# Patient Record
Sex: Female | Born: 1955 | Race: White | Hispanic: No | Marital: Married | Smoking: Never smoker
Health system: Southern US, Community
[De-identification: ages and names within clinical notes are randomized; demographics above are authoritative.]

## PROBLEM LIST (undated history)

## (undated) DIAGNOSIS — C569 Malignant neoplasm of unspecified ovary: Secondary | ICD-10-CM

## (undated) DIAGNOSIS — T4145XA Adverse effect of unspecified anesthetic, initial encounter: Secondary | ICD-10-CM

## (undated) DIAGNOSIS — R112 Nausea with vomiting, unspecified: Secondary | ICD-10-CM

## (undated) DIAGNOSIS — Z9889 Other specified postprocedural states: Secondary | ICD-10-CM

## (undated) DIAGNOSIS — T8859XA Other complications of anesthesia, initial encounter: Secondary | ICD-10-CM

## (undated) DIAGNOSIS — K219 Gastro-esophageal reflux disease without esophagitis: Secondary | ICD-10-CM

## (undated) HISTORY — PX: NASAL SINUS SURGERY: SHX719

## (undated) HISTORY — PX: BREAST SURGERY: SHX581

## (undated) HISTORY — PX: CHOLECYSTECTOMY: SHX55

## (undated) HISTORY — PX: TUBAL LIGATION: SHX77

## (undated) HISTORY — PX: ABDOMINAL HYSTERECTOMY: SHX81

---

## 1996-02-22 HISTORY — PX: BREAST EXCISIONAL BIOPSY: SUR124

## 1998-03-27 ENCOUNTER — Other Ambulatory Visit: Admission: RE | Admit: 1998-03-27 | Discharge: 1998-03-27 | Payer: Self-pay | Admitting: Obstetrics & Gynecology

## 1999-02-09 ENCOUNTER — Other Ambulatory Visit: Admission: RE | Admit: 1999-02-09 | Discharge: 1999-02-09 | Payer: Self-pay | Admitting: Obstetrics & Gynecology

## 1999-02-09 ENCOUNTER — Encounter (INDEPENDENT_AMBULATORY_CARE_PROVIDER_SITE_OTHER): Payer: Self-pay | Admitting: Specialist

## 1999-03-08 ENCOUNTER — Observation Stay (HOSPITAL_COMMUNITY): Admission: RE | Admit: 1999-03-08 | Discharge: 1999-03-09 | Payer: Self-pay | Admitting: Obstetrics & Gynecology

## 1999-03-08 ENCOUNTER — Encounter (INDEPENDENT_AMBULATORY_CARE_PROVIDER_SITE_OTHER): Payer: Self-pay | Admitting: Specialist

## 1999-08-17 ENCOUNTER — Encounter (HOSPITAL_BASED_OUTPATIENT_CLINIC_OR_DEPARTMENT_OTHER): Payer: Self-pay | Admitting: General Surgery

## 1999-08-19 ENCOUNTER — Ambulatory Visit (HOSPITAL_COMMUNITY): Admission: RE | Admit: 1999-08-19 | Discharge: 1999-08-20 | Payer: Self-pay | Admitting: General Surgery

## 1999-08-27 ENCOUNTER — Emergency Department (HOSPITAL_COMMUNITY): Admission: EM | Admit: 1999-08-27 | Discharge: 1999-08-27 | Payer: Self-pay | Admitting: Emergency Medicine

## 2002-05-23 ENCOUNTER — Ambulatory Visit (HOSPITAL_COMMUNITY): Admission: RE | Admit: 2002-05-23 | Discharge: 2002-05-23 | Payer: Self-pay | Admitting: Family Medicine

## 2002-05-23 ENCOUNTER — Encounter: Payer: Self-pay | Admitting: Family Medicine

## 2002-06-17 ENCOUNTER — Emergency Department (HOSPITAL_COMMUNITY): Admission: EM | Admit: 2002-06-17 | Discharge: 2002-06-17 | Payer: Self-pay | Admitting: Emergency Medicine

## 2002-08-20 ENCOUNTER — Emergency Department (HOSPITAL_COMMUNITY): Admission: EM | Admit: 2002-08-20 | Discharge: 2002-08-20 | Payer: Self-pay | Admitting: Emergency Medicine

## 2004-04-13 ENCOUNTER — Encounter: Admission: RE | Admit: 2004-04-13 | Discharge: 2004-04-13 | Payer: Self-pay | Admitting: Sports Medicine

## 2004-04-27 ENCOUNTER — Encounter: Admission: RE | Admit: 2004-04-27 | Discharge: 2004-04-27 | Payer: Self-pay | Admitting: Sports Medicine

## 2004-05-27 ENCOUNTER — Encounter: Admission: RE | Admit: 2004-05-27 | Discharge: 2004-05-27 | Payer: Self-pay | Admitting: Sports Medicine

## 2004-07-01 ENCOUNTER — Ambulatory Visit (HOSPITAL_BASED_OUTPATIENT_CLINIC_OR_DEPARTMENT_OTHER): Admission: RE | Admit: 2004-07-01 | Discharge: 2004-07-01 | Payer: Self-pay | Admitting: Otolaryngology

## 2004-07-04 ENCOUNTER — Ambulatory Visit: Payer: Self-pay | Admitting: Internal Medicine

## 2005-03-18 ENCOUNTER — Encounter (INDEPENDENT_AMBULATORY_CARE_PROVIDER_SITE_OTHER): Payer: Self-pay | Admitting: *Deleted

## 2005-03-18 ENCOUNTER — Ambulatory Visit (HOSPITAL_BASED_OUTPATIENT_CLINIC_OR_DEPARTMENT_OTHER): Admission: RE | Admit: 2005-03-18 | Discharge: 2005-03-18 | Payer: Self-pay | Admitting: Otolaryngology

## 2007-08-03 ENCOUNTER — Ambulatory Visit (HOSPITAL_COMMUNITY): Admission: RE | Admit: 2007-08-03 | Discharge: 2007-08-03 | Payer: Self-pay | Admitting: Family Medicine

## 2010-03-14 ENCOUNTER — Encounter: Payer: Self-pay | Admitting: Sports Medicine

## 2010-07-09 NOTE — Consult Note (Signed)
NAME:  [name], [name]                          ACCOUNT NO.:  [id number]   MEDICAL RECORD NO.:  [id number]                   PATIENT TYPE:  EMS   LOCATION:  MAJO                                 FACILITY:  [facility]   PHYSICIAN:  Genene Churn. Love, M.D.                 DATE OF BIRTH:  [birth year]   DATE OF CONSULTATION:  06/17/2002  DATE OF DISCHARGE:                                   CONSULTATION   PATIENT ADDRESS:  [address]  [place], [place] [address]   CONSULTATION:  This 55 year old white divorced female was seen in the  emergency room at the request of Dr. Ignacia Palma, since she has an appointment  to be seen for new-patient evaluation by [facility] and  came to the emergency room today.   Her complaint is that of left hip and leg pain.   HISTORY OF PRESENT ILLNESS:  This patient has a greater than one-year  history of left groin, left hip and left leg pain involving the left  anterior and, at times, inner aspect of the leg, going to the knee.  This,  however, occasionally does go below the knee, into the left L4 dermatomal  distribution.  Her pain can go down the left leg from the groin in two  directions, one along the inner aspect and another along the anterior and  lateral aspect of her leg.  It has been worse over the last two months, and  she has seen a chiropractor without any relief.  She was seen by Dr. Tasia Catchings,  the orthopedist, in [place].  She had MRI study of the hips 05/23/02,  which I reviewed, showing a right ovarian cyst but no other abnormalities.  She  had an MRI of the lumbar spine 06/05/02, which I reviewed, which shows  no significant abnormalities.  She says the pain is like being hit with a  hammer.  There is no associated burning, numbness or tingling, but she  notices a hyperesthetic area along the lateral aspect of her left leg.  She  has also noted, at times, that this will go down her right leg.  She states  it is not related  to bending over, cough, sneeze, or Valsalva maneuver.  It  is possibly made worse by being in the sitting position such as riding in a  car for a prolonged period of time or riding a riding lawnmower.  She does  notice that it can awaken her at night.  She feels more comfortable with her  legs flexed at the knees.  She has had no history of Lhermitte sign, no  history of head or neck trauma and no bowel or bladder dysfunction.  Review  of the MRI study in the retroperitoneal region, by my review, shows no  significant abnormalities.   SOCIAL HISTORY:  She does not smoke cigarettes or drink alcohol.   ALLERGIES:  She does have an intolerance to SULFA, developing nausea and  vomiting.   CURRENT MEDICATIONS:  1. Tylenol.  2. Imitrex.  3. Tramadol.   PAST SURGICAL HISTORY:  She has had operations, with wisdom teeth removed 20  years ago, tubal ligation 17 years ago, sinus surgery in November of 1997,  left breast biopsy, August 1998.  She had a hysterectomy, January of 2001  and a cholecystectomy, June of 2001.   FAMILY HISTORY:  Her mother died at age 62 from ALS.  Her father is 42,  doing well.  She has a brother 68 and well.  She has two children, 17 and  18.  She is divorced and plans to be married in the near future.   PHYSICAL EXAMINATION:  GENERAL:  A well-developed pleasant white female in  no acute distress.  VITAL SIGNS:  Her blood pressure in right and left arm is 120/60 in lying  position with a heart rate of 60 and regular.  No carotid or supraclavicular  bruits could be heard.  NEUROLOGICAL:  Mechanical testing revealed negative compression and  palpation over the anterior iliac crest with some slight tenderness on the  left side.  She had, also, no discomfort over the SI joints bilaterally.  Forward bending, lateral rotation, lateral bending maneuvers and lateral  rotation maneuvers of the truncal region were unremarkable.  Internal and  external rotation maneuvers of  the hips were unremarkable.  Mental Status:  She was alert, oriented x3, followed 1, 2 and 3 step  commands.  Cranial nerve examination revealed visual fields full, discs  flat, spontaneous venous pulsations and extraocular movements full.  Cornea;s present.  Facial sensation equal.  No facial motor asymmetry.  Hearing present.  Air conduction greater than bone conduction.  Tongue  midline.  Uvula midline.  Gag is present.  Sternocleidomastoid, trapezius  testing normal.  Motor examination:  Good strength in the upper and lower  extremities, including iliopsoas, quadriceps, femoris and gluteus maximus,  gluteus medius, EHL, anterior tibialis, peronei, posterior tibialis and  gastroc soleus muscle groups.  The sensory examination was intact, including  the left lateral femoral cutaneous nerve distribution.  The deep tendon  reflexes were 2-3+ in the lower extremities, 2+ in the upper extremities.  Gait examination revealed good tandem toe-and-heel walking.  Romberg testing  was negative.   LABORATORY DATA:  Review was obtained today of hip x-rays, lumbar spine x-  rays, AP and lateral, MRI of the lumbar spine and MRI of the hip region.   IMPRESSION:  Suspect left-sided meralgia paresthetic, code 355.1   PLAN:  1. Nerve conduction EMG.  2. Neurontin 100 mg t.i.d., then 300 mg t.i.d.                                               Genene Churn. Sandria Manly, M.D.    JML/MEDQ  D:  06/17/2002  T:  06/18/2002  Job:  [id number]   cc:   Casimiro Needle L. Thad Ranger, M.D.  [address]  [address]  [place]  [place] [address]  Fax: [phone]   Carleene Cooper III, M.D.  [address]  [place]  [place] [address]  Fax: [phone]

## 2010-07-09 NOTE — Op Note (Signed)
[facility]  Patient:    [name]                        MRN: [id number] Proc. Date: 03/08/99 Adm. Date:  [id number] Attending:  Lars Pinks                           Operative Report  PREOPERATIVE DIAGNOSIS:       Menorrhagia.  POSTOPERATIVE DIAGNOSIS:      Menorrhagia.  OPERATION:                    Transvaginal hysterectomy.  SURGEON:                      Richard D. Arlyce Dice, M.D.  ASSISTANT:                    Luvenia Redden, M.D.  ANESTHESIA:                   General endotracheal.  ESTIMATED BLOOD LOSS:         100 cc.  FINDINGS:                     Normal-appearing uterus and ovaries.  INDICATIONS:                  This is a 55 year old, gravida 2, para 2, who presented with a 18-month history of heavy menstrual periods.  The periods are o heavy that she floods through her pads and tampons and has to leave work to change her clothes.  During this time her hemoglobin has been in the upper 10 to low 11 range, and she does not respond to iron therapy.  The patient has tried birth control pills in the past, which caused severe headaches, and she used high doses of nonsteroidal anti-inflammatory drugs, and these also failed to abate the heavy flow.  The patient underwent a uterine ultrasound and sonographic hysterogram, nd this showed no evidence of polyps or submucous myoma.  Because of the persistence of the heavy bleeding and the fact that this symptom interfered with the patients work and social activities, the decision was made to proceed with hysterectomy.  DESCRIPTION OF PROCEDURE:     The patient was taken to the operating room, and general anesthesia was induced.  She was then placed in the dorsolithotomy position, and the vagina, perineum were prepped and draped in a sterile fashion. The cervix was grasped with a Christella Hartigan tenaculum, and the paracervical tissues were infiltrated with a dilute epinephrine  solution of 0.5% Marcaine.  The cervix was circumcised, and the vaginal tissues were then dissected free.  The bladder was  freed anteriorly.  The posterior cul-de-sac was then entered sharply.  The uterosacral ligaments and base of the cardinal ligaments were clamped, cut, and  ligated.  The anterior cul-de-sac was then entered sharply.  The uterine artery was then clamped and doubly ligated.  The dissection was continued through the broad ligament.  The uterus was then delivered posteriorly, and the remainder of the broad ligament and utero-ovarian anastomosis were then clamped and doubly ligated. The uterus and cervix were removed.  The posterior vaginal cuff was then closed  with a running Vicryl #1 suture.  The cul-de-sac was closed with an interrupted  chromic suture.  The peritoneum was closed with a purse-string  chronic suture. The anterior bladder cuff was then closed with interrupted chronic suture.  The procedure was then terminated, and the patient left the operating room in good condition.  The bladder was catheterized while the patient was in the operating  room, and clear urine was obtained. DD:  03/08/99 TD:  03/08/99 Job: [id number]

## 2010-07-09 NOTE — Op Note (Signed)
[name]  Patient:    [name], [name]                       MRN: [id number] Proc. Date: 08/19/99 Adm. Date:  [id number] Attending:  Sonda Primes CC:         Mardene Celeste. Lurene Shadow, M.D., 2 copies                           Operative Report  PREOPERATIVE DIAGNOSIS:  Biliary dyskinesia.  POSTOPERATIVE DIAGNOSIS:  Biliary dyskinesia.  OPERATION:  Laparoscopic cholecystectomy.  SURGEON:  Mardene Celeste. Lurene Shadow, M.D.  ASSISTANT:  Marnee Spring. Wiliam Ke, M.D.  ANESTHESIA:  General.  INDICATIONS FOR PROCEDURE:  This patient is a 55 year old woman presenting with recurrent right upper quadrant pain following meals, associated with nausea and with some emesis.  She underwent gallbladder ultrasonography which showed no stones and subsequently with hepatobiliary scan which showed no cystic duct obstruction, but did show there was no more than a 12% ejection from the gallbladder, consistent with dyskinesia.  She is brought to the operating room now for laparoscopic cholecystectomy.  DESCRIPTION OF OPERATION:  Following induction of anesthesia, the patient was positioned supine and the abdomen is routinely prepped and draped to be included in the sterile operative field.  Opened laparoscopy at the umbilicus with insufflation of the peritoneum to 14 mm of mercury.  Pressure using carbon dioxide was carried out.  Camera was inserted and visual exploration of the abdomen ensued.  Anterior gastric wall, duodenum appeared to be normal. There were some adhesions from the neck of the gallbladder to the duodenum. The gallbladder was moderately scarred and liver edges were sharp, liver surface smooth.  Neither the small nor large intestine appeared to be abnormal.  On direct vision, epigastric and lateral ports were placed.  The gallbladder was grasped and retracted cephalad and dissection carried down in the region of the hepatoduodenal ligament, isolating the cystic  artery and cystic duct. The cystic artery was doubly clipped and transected.  The cystic duct was clipped proximally and also opened.  I attempted a cystic duct cholangiogram, however, the cystic duct was very small and the catheter could not be placed into the very small caliber cystic duct.  The cholangiogram effort was abandoned and the cystic duct was triply clipped and then transected and the gallbladder dissected free from the liver bed using electrocautery and maintaining hemostasis throughout the entire course of the dissection.  At the end of the dissection, the liver bed was thoroughly inspected for hemostasis and noted to be dry.  The right upper quadrant was thoroughly irrigated with saline and aspirated.  Camera was placed in the epigastric port and the gallbladder retrieved through the umbilical port without difficulty.  Pneumoperitoneum allowed to deflate. Sponge, instrument and sharp counts verified.  Wounds were closed in layers as follows:  Umbilical wound in two layers with 0 Dexon and 4-0 Dexon, epigastric wound and lateral flank wounds closed with 4-0 Dexon.  All wounds were reinforced with Steri-Strips.  Sterile dressings were applied.  Anesthetic was reversed and patient removed form the operating room to the recovery room in stable condition, having tolerated the procedure well. DD:  08/19/99 TD:  08/20/99 Job: [id number]

## 2010-07-09 NOTE — H&P (Signed)
NAMESEDONIA, [name]                 ACCOUNT NO.:  [id number]   MEDICAL RECORD NO.:  [id number]          PATIENT TYPE:  AMB   LOCATION:  DSC                          FACILITY:  [facility]   PHYSICIAN:  Hermelinda Medicus, M.D.   DATE OF BIRTH:  1955-05-25   DATE OF ADMISSION:  DATE OF DISCHARGE:                                HISTORY & PHYSICAL   This patient is a 55 year old female who has had a sleep study showing some  fatigue, but really not a sleep apnea totally problem.  Her oxygen  desaturation was 91%.  Her RDI was 5.5 making it seem more like a snoring  type problem.  She has been waking up fatigued.  She has had a considerable  snoring difficulty, breathing through her nose, waking herself up because of  nasal obstruction.  She now enters for a septal reconstruction/turbinate  reduction with an LAUP (laser-assisted uvulopalatoplasty) under local MAC  anesthesia.  Her past history is that of being on Imitrex 25 and Allegra-D  one-half tablet h.s. and in the a.m. as necessary.  She does not drink.  She  does not smoke.  She has had nasal surgery in 1997, gallbladder in 2001,  breast biopsy in 1998, tubal in 1987, a hysterectomy in 2001.  She does have  occasional migraines, TMJ problems.   PHYSICAL EXAMINATION:  GENERAL:  She is a well-nourished, well-developed  female in no acute distress.  VITAL SIGNS:  Blood pressure 115/79, heart rate 94.  Hemoglobin is 13.1.  She weighs 142 with a BMI of 23.  5 feet 6 inches tall.  HEENT:  Her ears are clear.  The tympanic membranes are clear.  Her nose  shows considerable congestion and septal deviation with turbinate  hypertrophy blocking quite a narrow nose.  Her palate is somewhat low.  Her  uvula is large and her larynx is clear.  True cords, false cords,  epiglottis, base of tongue are clear.  True cord mobility, gag reflex,  tongue mobility, EOMs, facial nerve are all within normal limits and  symmetrical as is shoulder strength.  Neck is  free of any thyromegaly,  cervical adenopathy, or mass.  Oral cavity is clear.  No ulceration or mass.  CHEST:  Clear.  No rales, rhonchi, or wheezes.  CARDIOVASCULAR:  No heaves, snaps, murmurs or gallops.  ABDOMEN:  Unremarkable.  EXTREMITIES:  Unremarkable.   INITIAL DIAGNOSES:  Nasal obstruction with history of snoring with redundant  uvula and palate.           ______________________________  Hermelinda Medicus, M.D.     JC/MEDQ  D:  03/18/2005  T:  03/18/2005  Job:  [id number]   cc:   Kirk Ruths, M.D.  Fax: [phone]

## 2010-07-09 NOTE — Op Note (Signed)
NAMETAQUISHA, [name]                 ACCOUNT NO.:  [id number]   MEDICAL RECORD NO.:  [id number]          PATIENT TYPE:  AMB   LOCATION:  DSC                          FACILITY:  [facility]   PHYSICIAN:  [name], M.D.   DATE OF BIRTH:  07-10-1955   DATE OF PROCEDURE:  03/18/2005  DATE OF DISCHARGE:                                 OPERATIVE REPORT   PREOP DIAGNOSIS:  Snoring with septal deviation with nasal obstruction with  interrupted sleep with fatigue with a RDI of 5.5 and O2 nadir of 91%.   POSTOP DIAGNOSIS:  Snoring with septal deviation with nasal obstruction with  interrupted sleep with fatigue with a RDI of 5.5 and O2 nadir of 91%.   OPERATION:  Septal reconstruction, turbinate reduction and a laser assisted  uvulopalatoplasty.   OPERATOR:  Dr. Hermelinda Cherry.   ANESTHESIA:  MAC with Dr. Gelene Mink.   PROCEDURE:  Patient placed in supine position and under local anesthesia,  the patient was prepped and draped appropriately using the usual head drape.  The topical cocaine 200 mg and 1% Xylocaine with epinephrine was used on the  nasal cavity and the nose had had previous surgery back in 97, but still a  persistent septal deviation in the high and the ethmoid region, especially  on the right side and a thickening in that area is blocking this very narrow  nose.  An incision after anesthesia was instilled was made anterior to this  ethmoid region and the mucoperichondrium and periosteum was elevated on that  side on the right side and then on the left side in the septal envelope.  Once this was achieved, then [facility] forceps were used to take this excess  and deviated ethmoid septum back to where the septum was established in the  midline.  We were able also to push some of this deviated septum over to the  midline so it would save [place] removing this.  Once this was completed, the  vomerine septum was in good condition. She had a very small perforation of  the floor the nose  which we wanted to avoid and then the closure was begun  using 4-0 through-and-through septal suture as a mattress stitch and once  this was completed, then the inferior turbinates were aggressively  outfractured.  We felt that it was not necessary to use Elmed bipolar  cautery. The middle turbinates were also checked and were also partially  obstructing and were slightly infractured. Once this was achieved,  intranasal dressing was placed and then the oral cavity was approached using  the Cetacaine spray and then using 1% Xylocaine with epinephrine in the  uvula and just lateral to this where the laser incisions were to be made.  Once the throat was numb, the wooden tongue blade was used.  The eyes were  protected with wet sponges and a wet towel and glasses were used within the  room.  The laser was set at 10 W and used for 6 minutes. The laser was used  each side of the uvula to make diagonal cuts in the  palate to get this to  heal and elevate the contour the palate slightly approximately 5-7 mm. The  uvula which was redundant and large.  Half of this was taken  again using the laser.  Once this was completed, the oropharynx was  suctioned, the patient did very well, was taken to recovery room and we will  probably remove this packing this afternoon in the office. Her follow-up  furthermore will be in the office this afternoon, then will be in 5 days, 10  days and 3 weeks, 6 weeks, 3 months and 6 months.           ______________________________  [name], M.D.     JC/MEDQ  D:  03/18/2005  T:  03/18/2005  Job:  [id number]   cc:   Kirk Ruths, M.D.  Fax: [phone]

## 2010-07-09 NOTE — Procedures (Signed)
[name], [name]                 ACCOUNT NO.:  [id number]   MEDICAL RECORD NO.:  [id number]          PATIENT TYPE:  OUT   LOCATION:  SLEEP CENTER                 FACILITY:  [facility]   PHYSICIAN:  Clinton D. Maple Hudson, M.D. DATE OF BIRTH:  1955-10-27   DATE OF STUDY:                              NOCTURNAL POLYSOMNOGRAM   REFERRING PHYSICIAN:  Hermelinda Medicus, M.D.   INDICATIONS FOR STUDY:  Hypersomnia with sleep apnea. Epworth sleepiness  score 13/24, BMI 22, weight 137 pounds.   SLEEP ARCHITECTURE:  Total sleep time 428 minutes with sleep efficiency of  86%. Stage I was 4%, stage II was 71%, stages III and IV were 13%, REM was  12% of total sleep time. Sleep latency was 28 minutes. REM latency 369  minutes. Awake after sleep onset 42 minutes. Arousal index increased at 47.  She took Celexa at bedtime for headache.   RESPIRATORY DATA:  Respiratory disturbance index (RDI, AHI) 5.5 obstructive  events per hour indicating minimal obstructive sleep apnea/hypopnea  syndrome.  There were 10 obstructive apneas and 29 hypopneas. Most events  were recorded while sleeping supine REM RDI of 0.  NPSG protocol was  ordered.   OXYGEN DATA:  Mild snoring while lying supine.  Oxygen desaturation was to a  nadir of 91% with mean oxygen saturation through the study of 98% on room  air.   CARDIAC DATA:  Sinus rhythm with occasional PVC.   MOVEMENT/PARASOMNIA:  Occasional leg jerks with arousal, 1.1 per hour,  nonsignificant.   IMPRESSION/RECOMMENDATIONS:  1.  Adequate total sleep time with unremarkable sleep architecture, but she      still complained of being tired the next morning.  Question if Celexa at      bedtime is contributing to this feeling or there is a disorder of      primary increase in daytime somnolence.  2.  Minimal obstructive sleep apnea/hypopnea, RDI of 5.5 per hour with      events especially noted while lying supine.     CDY/MEDQ  D:  07/04/2004 14:51:15  T:  07/04/2004  19:55:54  Job:  [id number]

## 2012-04-24 DIAGNOSIS — M7710 Lateral epicondylitis, unspecified elbow: Secondary | ICD-10-CM | POA: Insufficient documentation

## 2013-02-21 HISTORY — PX: COLONOSCOPY: SHX174

## 2014-01-07 ENCOUNTER — Other Ambulatory Visit: Payer: Self-pay | Admitting: Obstetrics & Gynecology

## 2014-01-07 DIAGNOSIS — R928 Other abnormal and inconclusive findings on diagnostic imaging of breast: Secondary | ICD-10-CM

## 2014-01-21 ENCOUNTER — Ambulatory Visit
Admission: RE | Admit: 2014-01-21 | Discharge: 2014-01-21 | Disposition: A | Discharge status: Home / Self Care | Payer: BC Managed Care – PPO | Source: Ambulatory Visit | Attending: Obstetrics & Gynecology | Admitting: Obstetrics & Gynecology

## 2014-01-21 DIAGNOSIS — R928 Other abnormal and inconclusive findings on diagnostic imaging of breast: Secondary | ICD-10-CM

## 2014-02-06 ENCOUNTER — Encounter (HOSPITAL_COMMUNITY): Payer: Self-pay

## 2014-02-06 ENCOUNTER — Encounter (HOSPITAL_COMMUNITY)
Admission: RE | Admit: 2014-02-06 | Discharge: 2014-02-06 | Disposition: A | Discharge status: Home / Self Care | Payer: BC Managed Care – PPO | Source: Ambulatory Visit | Attending: Obstetrics & Gynecology | Admitting: Obstetrics & Gynecology

## 2014-02-06 DIAGNOSIS — K219 Gastro-esophageal reflux disease without esophagitis: Secondary | ICD-10-CM | POA: Diagnosis not present

## 2014-02-06 DIAGNOSIS — Z886 Allergy status to analgesic agent status: Secondary | ICD-10-CM | POA: Diagnosis not present

## 2014-02-06 DIAGNOSIS — D3912 Neoplasm of uncertain behavior of left ovary: Secondary | ICD-10-CM | POA: Diagnosis not present

## 2014-02-06 DIAGNOSIS — R1900 Intra-abdominal and pelvic swelling, mass and lump, unspecified site: Secondary | ICD-10-CM | POA: Diagnosis present

## 2014-02-06 HISTORY — DX: Adverse effect of unspecified anesthetic, initial encounter: T41.45XA

## 2014-02-06 HISTORY — DX: Gastro-esophageal reflux disease without esophagitis: K21.9

## 2014-02-06 HISTORY — DX: Other complications of anesthesia, initial encounter: T88.59XA

## 2014-02-06 LAB — CBC
HEMATOCRIT: 37.9 % (ref 36.0–46.0)
HEMOGLOBIN: 13.1 g/dL (ref 12.0–15.0)
MCH: 30.5 pg (ref 26.0–34.0)
MCHC: 34.6 g/dL (ref 30.0–36.0)
MCV: 88.3 fL (ref 78.0–100.0)
Platelets: 249 10*3/uL (ref 150–400)
RBC: 4.29 MIL/uL (ref 3.87–5.11)
RDW: 13.3 % (ref 11.5–15.5)
WBC: 7.6 10*3/uL (ref 4.0–10.5)

## 2014-02-06 NOTE — Patient Instructions (Addendum)
Your procedure is scheduled on:02/07/14  Enter through the Main Entrance at :[place] [facility] desk phone and dial [phone] and inform us of your arrival.  Please call [phone] if you have any problems the morning of surgery.  Remember: Do not eat food or drink liquids, including water, after midnight:tonight Clear liquids are ok until:7am on Friday   You may brush your teeth the morning of surgery.  Take these meds the morning of surgery with a sip of water:Omeprazole  DO NOT wear jewelry, eye make-up, lipstick,body lotion, or dark fingernail polish.  (Polished toes are ok) You may wear deodorant.  If you are to be admitted after surgery, leave suitcase in car until your room has been assigned. Patients discharged on the day of surgery will not be allowed to drive home. Wear loose fitting, comfortable clothes for your ride home.

## 2014-02-06 NOTE — H&P (Signed)
[name] is an 58 y.o. female. She was found to have a 17 cm adnexal mass that appeared benign on ultrasound.  Her CA 125 was 8.  I conferred with Dr. Everitt Amber and she felt that it was OK to proceed with laparoscopic removal.  This was discussed with the patient and she understands the small added risk of minimally invasive surgery if this does indeed turn out to be malignant.  Pertinent Gynecological History: Blood transfusions: none Sexually transmitted diseases: no past history Previous GYN Procedures: Vaginal Hysterectomy  Last mammogram: normal Date: 01/21/2014 OB History: G2, P2     Past Medical History  Diagnosis Date  . GERD (gastroesophageal reflux disease)   . Complication of anesthesia     "hard to wake up"    Past Surgical History  Procedure Laterality Date  . Tubal ligation    . Abdominal hysterectomy    . Cholecystectomy    . Breast surgery    . Nasal sinus surgery      No family history on file.  Social History:  reports that she has never smoked. She does not have any smokeless tobacco history on file. She reports that she does not drink alcohol or use illicit drugs.  Allergies:  Allergies  Allergen Reactions  . Tylenol [Acetaminophen] Rash    Severe hives    No prescriptions prior to admission    Review of Systems  Constitutional: Negative.   Eyes: Negative.   Respiratory: Negative.   Cardiovascular: Negative.   Gastrointestinal: Negative.   Genitourinary: Negative.   Neurological: Positive for headaches.    There were no vitals taken for this visit. Physical Exam  Constitutional: She is oriented to person, place, and time. She appears well-developed and well-nourished.  HENT:  Head: Normocephalic.  Eyes: Pupils are equal, round, and reactive to light.  Neck: Normal range of motion.  Cardiovascular: Normal rate.   Respiratory: Effort normal.  GI: She exhibits mass.  Genitourinary: Vagina normal.  Musculoskeletal: Normal range of  motion.  Neurological: She is alert and oriented to person, place, and time.  Skin: Skin is warm and dry.  Psychiatric: She has a normal mood and affect.    Results for orders placed or performed during the hospital encounter of 02/06/14 (from the past 24 hour(s))  CBC     Status: None   Collection Time: 02/06/14  9:18 AM  Result Value Ref Range   WBC 7.6 4.0 - 10.5 K/uL   RBC 4.29 3.87 - 5.11 MIL/uL   Hemoglobin 13.1 12.0 - 15.0 g/dL   HCT 37.9 36.0 - 46.0 %   MCV 88.3 78.0 - 100.0 fL   MCH 30.5 26.0 - 34.0 pg   MCHC 34.6 30.0 - 36.0 g/dL   RDW 13.3 11.5 - 15.5 %   Platelets 249 150 - 400 K/uL    No results found.  Assessment/Plan: 17 cm adnexal mass that appears benign on ultrasound and normal CA 125.  Will proceed with laparoscopic BSO with removal of cyst.  Traeger Sultana D 02/06/2014, 3:35 PM

## 2014-02-07 ENCOUNTER — Ambulatory Visit (HOSPITAL_COMMUNITY)
Admission: RE | Admit: 2014-02-07 | Discharge: 2014-02-07 | Disposition: A | Discharge status: Home / Self Care | Payer: BC Managed Care – PPO | Source: Ambulatory Visit | Attending: Obstetrics & Gynecology | Admitting: Obstetrics & Gynecology

## 2014-02-07 ENCOUNTER — Encounter (HOSPITAL_COMMUNITY): Payer: Self-pay

## 2014-02-07 ENCOUNTER — Ambulatory Visit (HOSPITAL_COMMUNITY): Payer: BC Managed Care – PPO | Admitting: Anesthesiology

## 2014-02-07 ENCOUNTER — Encounter (HOSPITAL_COMMUNITY)
Admission: RE | Disposition: A | Discharge status: Home / Self Care | Payer: Self-pay | Source: Ambulatory Visit | Attending: Obstetrics & Gynecology

## 2014-02-07 DIAGNOSIS — D3912 Neoplasm of uncertain behavior of left ovary: Secondary | ICD-10-CM | POA: Insufficient documentation

## 2014-02-07 DIAGNOSIS — K219 Gastro-esophageal reflux disease without esophagitis: Secondary | ICD-10-CM | POA: Insufficient documentation

## 2014-02-07 DIAGNOSIS — Z886 Allergy status to analgesic agent status: Secondary | ICD-10-CM | POA: Insufficient documentation

## 2014-02-07 HISTORY — PX: LAPAROSCOPY: SHX197

## 2014-02-07 HISTORY — PX: LAPAROSCOPIC BILATERAL SALPINGO OOPHERECTOMY: SHX5890

## 2014-02-07 HISTORY — DX: Nausea with vomiting, unspecified: Z98.890

## 2014-02-07 HISTORY — DX: Nausea with vomiting, unspecified: R11.2

## 2014-02-07 SURGERY — LAPAROSCOPY OPERATIVE
Anesthesia: General | Site: Abdomen

## 2014-02-07 MED ORDER — PROMETHAZINE HCL 25 MG/ML IJ SOLN: 6.2500 mg | INTRAMUSCULAR | Status: DC | PRN

## 2014-02-07 MED ORDER — HYDROMORPHONE HCL 1 MG/ML IJ SOLN
INTRAMUSCULAR | Status: AC
  Filled 2014-02-07: qty 1

## 2014-02-07 MED ORDER — BUPIVACAINE HCL (PF) 0.25 % IJ SOLN
INTRAMUSCULAR | Status: AC
  Filled 2014-02-07: qty 30

## 2014-02-07 MED ORDER — PROPOFOL 10 MG/ML IV EMUL
INTRAVENOUS | Status: AC
  Filled 2014-02-07: qty 20

## 2014-02-07 MED ORDER — ONDANSETRON HCL 4 MG/2ML IJ SOLN
INTRAMUSCULAR | Status: DC | PRN
  Administered 2014-02-07: 4 mg via INTRAVENOUS

## 2014-02-07 MED ORDER — GLYCOPYRROLATE 0.2 MG/ML IJ SOLN
INTRAMUSCULAR | Status: AC
  Filled 2014-02-07: qty 4

## 2014-02-07 MED ORDER — KETOROLAC TROMETHAMINE 30 MG/ML IJ SOLN
INTRAMUSCULAR | Status: DC | PRN
  Administered 2014-02-07: 30 mg via INTRAVENOUS

## 2014-02-07 MED ORDER — NEOSTIGMINE METHYLSULFATE 10 MG/10ML IV SOLN
INTRAVENOUS | Status: DC | PRN
  Administered 2014-02-07: 2.5 mg via INTRAVENOUS

## 2014-02-07 MED ORDER — MIDAZOLAM HCL 2 MG/2ML IJ SOLN: 0.5000 mg | Freq: Once | INTRAMUSCULAR | Status: DC | PRN

## 2014-02-07 MED ORDER — GLYCOPYRROLATE 0.2 MG/ML IJ SOLN
INTRAMUSCULAR | Status: DC | PRN
  Administered 2014-02-07: .5 mg via INTRAVENOUS

## 2014-02-07 MED ORDER — ROCURONIUM BROMIDE 100 MG/10ML IV SOLN
INTRAVENOUS | Status: DC | PRN
  Administered 2014-02-07: 10 mg via INTRAVENOUS
  Administered 2014-02-07: 30 mg via INTRAVENOUS
  Administered 2014-02-07: 10 mg via INTRAVENOUS

## 2014-02-07 MED ORDER — HEPARIN SODIUM (PORCINE) 5000 UNIT/ML IJ SOLN
INTRAMUSCULAR | Status: AC
  Filled 2014-02-07: qty 1

## 2014-02-07 MED ORDER — STERILE WATER FOR IRRIGATION IR SOLN
Status: DC | PRN
  Administered 2014-02-07: 1000 mL

## 2014-02-07 MED ORDER — LACTATED RINGERS IR SOLN
Status: DC | PRN
  Administered 2014-02-07: 3000 mL

## 2014-02-07 MED ORDER — ONDANSETRON HCL 4 MG/2ML IJ SOLN
INTRAMUSCULAR | Status: AC
  Filled 2014-02-07: qty 2

## 2014-02-07 MED ORDER — LIDOCAINE HCL (CARDIAC) 20 MG/ML IV SOLN
INTRAVENOUS | Status: AC
  Filled 2014-02-07: qty 5

## 2014-02-07 MED ORDER — LIDOCAINE HCL (CARDIAC) 20 MG/ML IV SOLN
INTRAVENOUS | Status: DC | PRN
  Administered 2014-02-07: 40 mg via INTRAVENOUS

## 2014-02-07 MED ORDER — PROPOFOL 10 MG/ML IV BOLUS
INTRAVENOUS | Status: DC | PRN
  Administered 2014-02-07: 50 mg via INTRAVENOUS
  Administered 2014-02-07: 150 mg via INTRAVENOUS

## 2014-02-07 MED ORDER — HYDROMORPHONE HCL 1 MG/ML IJ SOLN
INTRAMUSCULAR | Status: DC | PRN
  Administered 2014-02-07 (×2): 1 mg via INTRAVENOUS

## 2014-02-07 MED ORDER — HYDROMORPHONE HCL 1 MG/ML IJ SOLN: 0.2500 mg | INTRAMUSCULAR | Status: DC | PRN

## 2014-02-07 MED ORDER — FENTANYL CITRATE 0.05 MG/ML IJ SOLN
INTRAMUSCULAR | Status: AC
  Filled 2014-02-07: qty 5

## 2014-02-07 MED ORDER — DEXAMETHASONE SODIUM PHOSPHATE 10 MG/ML IJ SOLN
INTRAMUSCULAR | Status: DC | PRN
  Administered 2014-02-07: 4 mg via INTRAVENOUS

## 2014-02-07 MED ORDER — SODIUM CHLORIDE 0.9 % IJ SOLN
INTRAMUSCULAR | Status: AC
  Filled 2014-02-07: qty 10

## 2014-02-07 MED ORDER — NEOSTIGMINE METHYLSULFATE 10 MG/10ML IV SOLN
INTRAVENOUS | Status: AC
  Filled 2014-02-07: qty 1

## 2014-02-07 MED ORDER — FENTANYL CITRATE 0.05 MG/ML IJ SOLN
INTRAMUSCULAR | Status: DC | PRN
  Administered 2014-02-07: 100 ug via INTRAVENOUS
  Administered 2014-02-07 (×3): 50 ug via INTRAVENOUS

## 2014-02-07 MED ORDER — MEPERIDINE HCL 25 MG/ML IJ SOLN: 6.2500 mg | INTRAMUSCULAR | Status: DC | PRN

## 2014-02-07 MED ORDER — MIDAZOLAM HCL 5 MG/5ML IJ SOLN
INTRAMUSCULAR | Status: DC | PRN
  Administered 2014-02-07: 2 mg via INTRAVENOUS

## 2014-02-07 MED ORDER — KETOROLAC TROMETHAMINE 30 MG/ML IJ SOLN
INTRAMUSCULAR | Status: AC
  Filled 2014-02-07: qty 1

## 2014-02-07 MED ORDER — DEXAMETHASONE SODIUM PHOSPHATE 10 MG/ML IJ SOLN
INTRAMUSCULAR | Status: AC
  Filled 2014-02-07: qty 1

## 2014-02-07 MED ORDER — HEPARIN SODIUM (PORCINE) 5000 UNIT/ML IJ SOLN
INTRAMUSCULAR | Status: DC | PRN
  Administered 2014-02-07: 5000 [IU]

## 2014-02-07 MED ORDER — LACTATED RINGERS IV SOLN
INTRAVENOUS | Status: DC
  Administered 2014-02-07 (×4): via INTRAVENOUS

## 2014-02-07 MED ORDER — SCOPOLAMINE 1 MG/3DAYS TD PT72
1.0000 | MEDICATED_PATCH | Freq: Once | TRANSDERMAL | Status: DC
  Administered 2014-02-07: 1.5 mg via TRANSDERMAL

## 2014-02-07 MED ORDER — SCOPOLAMINE 1 MG/3DAYS TD PT72
MEDICATED_PATCH | TRANSDERMAL | Status: AC
  Administered 2014-02-07: 1.5 mg via TRANSDERMAL
  Filled 2014-02-07: qty 1

## 2014-02-07 MED ORDER — MIDAZOLAM HCL 2 MG/2ML IJ SOLN
INTRAMUSCULAR | Status: AC
  Filled 2014-02-07: qty 2

## 2014-02-07 SURGICAL SUPPLY — 29 items
APPLICATOR CHLORAPREP 3ML ORNG (MISCELLANEOUS) ×4 IMPLANT
BAG SPEC RTRVL LRG 6X4 10 (ENDOMECHANICALS) ×2
BLADE SURG 11 STRL SS (BLADE) ×8 IMPLANT
CABLE HIGH FREQUENCY MONO STRZ (ELECTRODE) IMPLANT
CATH ROBINSON RED A/P 16FR (CATHETERS) ×4 IMPLANT
CLOTH BEACON ORANGE TIMEOUT ST (SAFETY) ×4 IMPLANT
DRSG COVADERM PLUS 2X2 (GAUZE/BANDAGES/DRESSINGS) ×8 IMPLANT
DRSG OPSITE POSTOP 3X4 (GAUZE/BANDAGES/DRESSINGS) IMPLANT
DRSG XEROFORM 1X8 (GAUZE/BANDAGES/DRESSINGS) ×4 IMPLANT
FORCEPS CUTTING 33CM 5MM (CUTTING FORCEPS) ×4 IMPLANT
FORCEPS CUTTING 45CM 5MM (CUTTING FORCEPS) IMPLANT
GLOVE ECLIPSE 6.0 STRL STRAW (GLOVE) ×4 IMPLANT
GLOVE ECLIPSE 6.5 STRL STRAW (GLOVE) ×4 IMPLANT
GOWN STRL REUS W/TWL LRG LVL3 (GOWN DISPOSABLE) ×8 IMPLANT
LIQUID BAND (GAUZE/BANDAGES/DRESSINGS) IMPLANT
NS IRRIG 1000ML POUR BTL (IV SOLUTION) ×4 IMPLANT
PACK LAPAROSCOPY BASIN (CUSTOM PROCEDURE TRAY) ×4 IMPLANT
PAD TRENDELENBURG OR TABLE (MISCELLANEOUS) ×4 IMPLANT
POUCH ENDO CATCH II 15MM (MISCELLANEOUS) ×4 IMPLANT
POUCH SPECIMEN RETRIEVAL 10MM (ENDOMECHANICALS) ×4 IMPLANT
PROTECTOR NERVE ULNAR (MISCELLANEOUS) ×8 IMPLANT
RINGERS IRRIG 1000ML POUR BTL (IV SOLUTION) IMPLANT
SET IRRIG TUBING LAPAROSCOPIC (IRRIGATION / IRRIGATOR) ×4 IMPLANT
SOLUTION ELECTROLUBE (MISCELLANEOUS) IMPLANT
SUT VICRYL 0 ENDOLOOP (SUTURE) IMPLANT
SUT VICRYL 0 UR6 27IN ABS (SUTURE) ×4 IMPLANT
SUT VICRYL 4-0 PS2 18IN ABS (SUTURE) ×4 IMPLANT
TOWEL OR 17X24 6PK STRL BLUE (TOWEL DISPOSABLE) ×8 IMPLANT
WATER STERILE IRR 1000ML POUR (IV SOLUTION) ×4 IMPLANT

## 2014-02-07 NOTE — Discharge Instructions (Addendum)
DISCHARGE INSTRUCTIONS: Laparoscopy  The following instructions have been prepared to help you care for yourself upon your return home today.  Wound care:  Do not get the incision wet for the first 24 hours. The incision should be kept clean and dry.  The Band-Aids or dressings may be removed the day after surgery.  Should the incision become sore, red, and swollen after the first week, check with your doctor.  Personal hygiene:  Shower the day after your procedure.  Activity and limitations:  Do NOT drive or operate any equipment today.  Do NOT lift anything more than 15 pounds for 2-3 weeks after surgery.  Do NOT rest in bed all day.  Walking is encouraged. Walk each day, starting slowly with 5-minute walks 3 or 4 times a day. Slowly increase the length of your walks.  Walk up and down stairs slowly.  Do NOT do strenuous activities, such as golfing, playing tennis, bowling, running, biking, weight lifting, gardening, mowing, or vacuuming for 2-4 weeks. Ask your doctor when it is okay to start.  Diet: Eat a light meal as desired this evening. You may resume your usual diet tomorrow.  Return to work: This is dependent on the type of work you do. For the most part you can return to a desk job within a week of surgery. If you are more active at work, please discuss this with your doctor.  What to expect after your surgery: You may have a slight burning sensation when you urinate on the first day. You may have a very small amount of blood in the urine. Expect to have a small amount of vaginal discharge/light bleeding for 1-2 weeks. It is not unusual to have abdominal soreness and bruising for up to 2 weeks. You may be tired and need more rest for about 1 week. You may experience shoulder pain for 24-72 hours. Lying flat in bed may relieve it.  Call your doctor for any of the following:  Develop a fever of 100.4 or greater  Inability to urinate 6 hours after discharge from  hospital  Severe pain not relieved by pain medications  Persistent of heavy bleeding at incision site  Redness or swelling around incision site after a week  Increasing nausea or vomiting  Patient Signature________________________________________ Nurse Signature_________________________________________    DO NOT TAKE ANY IBUPROFEN PRODUCTS UNTIL 9 PM TONIGHT.   CALL DR Deatra Ina FOR ANY PROBLEMS AT HIS CELL NUMBER [phone].  IF YOU HAVE ANY TROUBLE CALL THE OFFICE ALSO DAY OR NIGHT.

## 2014-02-07 NOTE — Anesthesia Postprocedure Evaluation (Signed)
  Anesthesia Post-op Note  Patient: [name]  Procedure(s) Performed: Procedure(s): LAPAROSCOPY OPERATIVE (N/A) LAPAROSCOPIC BILATERAL SALPINGO OOPHORECTOMY AND REMOVAL OF LEFT OVARIAN CYST AND PELVIC WASHINGS AND CYSTOSCOPY (Bilateral) Patient is awake and responsive. Pain and nausea are reasonably well controlled. Vital signs are stable and clinically acceptable. Oxygen saturation is clinically acceptable. There are no apparent anesthetic complications at this time. Patient is ready for discharge.

## 2014-02-07 NOTE — Op Note (Addendum)
Patient Name: [name] MRN: [id number]  Date of Surgery: 02/07/2014    PREOPERATIVE DIAGNOSIS: PELVIC MASS  POSTOPERATIVE DIAGNOSIS: PELVIC MASS   PROCEDURE: Laparoscopic BSO, Frozen section, cystoscopy  SURGEON: Janice Coffin. Deatra Ina M.D.  ASSISTANT: Allyn Kenner, D.O.  ANESTHESIA: General endotracheal  ESTIMATED BLOOD LOSS: 300 ml  FINDINGS: 20 cm left ovarian cyst with serous and serosanguanous fluid noted on aspiration.  Polypoid tissue was noted on the inner cyst wall after it was opened for drainage.  This was read as Granulosa cell tumor on frozen section.  The left and right tubes were normal.  The right ovary was small, atrophic, and benign in appearance.  The uterus was surgically absent s/p vaginal hysterectomy.  There was no ascites and no sign of pathology on the surface of the ovarian cyst, the pelvic peritoneum, the omentum, or any intraperitoneal structures.  Pending the final pathology report, the initial impression is stage 1.  The cyst, however, was opened intraperitoneally during the procedure.  COMPLICATIONS: None  INDICATIONS: 17 cm adnexal mass.  Normal CA 125.  No malignant features on ultrasound.  Pre op discussion with Gyn Onc felt it was appropriate to proceed with laparoscopic approach.  PROCEDURE IN DETAIL:  The abdomen, vagina, and perineum were prepped and draped in sterile fashion.  The bladder was catheterized. An incision was made at the umbilicus and the subumbilical fascia was identified, opened, and a loose vicryl 1 purse string suture was placed in the fascia.  The peritoneum was opened and the Hasson canula was introduced.  The laparoscope was was placed and a pneumoperitoneum was created.  Accessory 5 mm ports were placed in the right and left lower quadrants under direct visualization.  Peritoneal washings were obtained.  The ovarian cyst filled the pelvis and lower abdomen and was associated with the left ovary.  The cyst wall was opened and  abnormal glandular tissue was identified.  This tissue was biopsied and sent for frozen section.  The pathology was Granulosa cell tumor.  Dr. Denman George of gyn. onc. was called from the operating room and she felt that the best course was to complete the laparoscopic BSO and then refer the patient to them for further testing and therapy as appropriate .   The right ovary was small and atrophic measuring around 1 cm.  The right infundibulo-pelvic ligament was identified, cauterized, and cut with the Gyrus device.  The ovary and tube were removed but were separated from each other during the dissection.  They were placed in the cul de sac.  The left infundibulopelvic ligament was then identified, cauterized, and cut with the Gyrus device.  The tumor was decompressed until it fit in a 15 cm "kidney" specimen bag which was placed through the umbilical port.  The specimen was removed by morcelation while in the bag at the surface of the umbilicus.  I thought I saw the right ovary fall into the bag with the left ovarian specimen.  After the specimen and bag were removed, the right fallopian tube specimen was located and removed from the cul de sac. Despite extensive effort the right ovary could not be located.  Because of its small size the the extensive morcelation done on the left ovarian cyst I felt it was likely that even though I could not definitely identify the right ovary in the specimen, that the right ovary had been removed with the left ovary and cyst in the specimen bag.   The peritoneum was thoroughly irrigated.  The pneumoperitoneum was released and the purse string suture was tied to close the fascia.  The umbilical incision was closed with subcuticular 4-0 vicryl.  The 5 mm incisions were closed with suture and derma bond.  A cystoscopy was performed and bilateral ureteral jets were indentified.  All sponge and instrument counts were correct.   The patient tolerated the procedure well and left the operating  room in good condition.  Jaquise Faux D. Deatra Ina, M.D.

## 2014-02-07 NOTE — Transfer of Care (Signed)
Immediate Anesthesia Transfer of Care Note  Patient: [name]  Procedure(s) Performed: Procedure(s): LAPAROSCOPY OPERATIVE (N/A) LAPAROSCOPIC BILATERAL SALPINGO OOPHORECTOMY AND REMOVAL OF LEFT OVARIAN CYST AND PELVIC WASHINGS AND CYSTOSCOPY (Bilateral)  Patient Location: PACU  Anesthesia Type:General  Level of Consciousness: awake  Airway & Oxygen Therapy: Patient Spontanous Breathing and Patient connected to nasal cannula oxygen  Post-op Assessment: Report given to PACU RN and Post -op Vital signs reviewed and stable  Post vital signs: stable  Complications: No apparent anesthesia complications

## 2014-02-07 NOTE — Anesthesia Preprocedure Evaluation (Signed)
Anesthesia Evaluation  Patient identified by MRN, date of birth, ID band Patient awake    Reviewed: Allergy & Precautions, H&P , Patient's Chart, lab work & pertinent test results, reviewed documented beta blocker date and time   History of Anesthesia Complications (+) PONV and history of anesthetic complications  Airway Mallampati: II  TM Distance: >3 FB Neck ROM: full    Dental   Pulmonary  breath sounds clear to auscultation        Cardiovascular Exercise Tolerance: Good Rhythm:regular Rate:Normal     Neuro/Psych    GI/Hepatic GERD-  Controlled,  Endo/Other    Renal/GU      Musculoskeletal   Abdominal   Peds  Hematology   Anesthesia Other Findings   Reproductive/Obstetrics                             Anesthesia Physical Anesthesia Plan  ASA: I  Anesthesia Plan: General ETT   Post-op Pain Management:    Induction:   Airway Management Planned:   Additional Equipment:   Intra-op Plan:   Post-operative Plan:   Informed Consent: I have reviewed the patients History and Physical, chart, labs and discussed the procedure including the risks, benefits and alternatives for the proposed anesthesia with the patient or authorized representative who has indicated his/her understanding and acceptance.   Dental Advisory Given  Plan Discussed with: CRNA and Surgeon  Anesthesia Plan Comments:         Anesthesia Quick Evaluation

## 2014-02-07 NOTE — Progress Notes (Signed)
I have interviewed and performed the pertinent exams on my patient to confirm that there have been no significant changes in her condition since the dictation of her history and physical exam.  

## 2014-02-10 ENCOUNTER — Telehealth: Payer: Self-pay | Admitting: *Deleted

## 2014-02-10 MED FILL — Heparin Sodium (Porcine) Inj 5000 Unit/ML: INTRAMUSCULAR | Qty: 1 | Status: AC

## 2014-02-10 NOTE — Telephone Encounter (Signed)
Received call from Dr. Deatra Ina stating [name] needs an appt with Dr. Denman George in January. [name] scheduled 02/24/14 at 9:45 with Dr. Denman George. Called [name] and she is agreeable to this appt date and time and to arrive 30 minutes before appt to check in. Gave [name] our phone number to call if she needs to reschedule or has any further questions.

## 2014-02-11 ENCOUNTER — Encounter (HOSPITAL_COMMUNITY): Payer: Self-pay | Admitting: Obstetrics & Gynecology

## 2014-02-19 ENCOUNTER — Encounter (HOSPITAL_COMMUNITY): Payer: Self-pay

## 2014-02-24 ENCOUNTER — Telehealth: Payer: Self-pay | Admitting: *Deleted

## 2014-02-24 ENCOUNTER — Ambulatory Visit: Payer: BLUE CROSS/BLUE SHIELD | Attending: Gynecologic Oncology | Admitting: Gynecologic Oncology

## 2014-02-24 ENCOUNTER — Encounter: Payer: Self-pay | Admitting: Gynecologic Oncology

## 2014-02-24 VITALS — BP 135/73 | HR 86 | Temp 98.4°F | Resp 16 | Ht 66.0 in | Wt 157.9 lb

## 2014-02-24 DIAGNOSIS — D3912 Neoplasm of uncertain behavior of left ovary: Secondary | ICD-10-CM | POA: Insufficient documentation

## 2014-02-24 DIAGNOSIS — D3910 Neoplasm of uncertain behavior of unspecified ovary: Secondary | ICD-10-CM

## 2014-02-24 NOTE — Progress Notes (Signed)
Consult Note: Gyn-Onc  Consult was requested by Dr. Deatra Ina for the evaluation of MERIAM CHOJNOWSKI 59 y.o. 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  CC:  Chief Complaint  Patient presents with  . granulosa cell tumor    Assessment/Plan:  Ms. MADDELYN ROCCA  is a 59 y.o.  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  Negative CA 125, Inhibin B and AMH pending  I discussed the nature of granulosa cell tumor of the ovary with Ms Lukes and the controversy regarding staging and adjuvant therapy. 1/ I recommend performing a "staging" CT of the chest/abdo/pelvis. This has been ordered through an outside provider per patient request. The purpose of this will be to help identify any macroscopic or bulky residual tumor or the residual right ovary which was not accounted for at pathology.  2/ It is my recommendation to consider at least 3 (versus 6) cycles of carboplatin and paclitaxel adjuvant chemotherapy (vs BEP) given A) intraoperative rupture of the tumor (stage IC3) B) retained right ovary at the time of surgery (and inability to clear the patient of bilateral disease). It is unlikely that such a small atrophic ovary will be identified at a second surgery. C) consideration for debulking if the CT scan demonstrates masses or signs concerning for residual macroscopic disease.  I do not recommend a separate staging procedure for Ms Castner in the absence of CT findings of residual disease, as I do not believe that it will change prognosis or alter my recommendations for chemotherapy (which I recommend regardless of the presence of macroscopic nodal, peritoneal or omental disease).  In event that the patient declines chemotherapy unless distant spread is found, I would recommend surgery to rule this out for her.  Ms Nault is in agreement with the plan and is proceeding with a second opinion prior  to deciding upon chemotherapy or a staging procedure. She will notify us of her plans.   HPI: Ms Rotundo is a 59 year old G2P2 who is 2 weeks s/p laparoscopic BSO for a 20cm left ovarian cyst. The cyst had been identified incidentally on annual pelvic examination. Preoperatively she had a normal CA 125 assessment, and [place] scan showed a mostly simple left ovarian cyst with a single small mural nodular component.  Intraoperatively the cyst was ruptured with intraperitoneal spread. Frozen section confirmed granulosa cell tumor. The specimen was sent out to [facility] for second opinion and was determined to be a GCT with high mitotic count. The surgeon stated that there was no visible evidence of spread with an otherwise normal peritoneal cavity. The surgery was performed entirely laparoscopically. During the attempted retrieval of the right ovary, the ovary was misplaced and not able to be found intraperitoneally and not removed. This right ovary was reported to be normal in appearance, atrophic and 1cm.  Postoperatively AMH and Inhibin B was drawn and is pending at the time of this consultation.  Interval History: She has healed well from her surgery with no major complaints.  Current Meds:  Outpatient Encounter Prescriptions as of 02/24/2014  Medication Sig  . desoximetasone (TOPICORT) 0.05 % cream Apply 1 application topically as needed. Per pt, she uses it as needed for itching  . naproxen sodium (ALEVE) 220 MG tablet Take 220 mg by mouth as needed.  Marland Kitchen omeprazole (PRILOSEC) 20 MG capsule Take 20 mg by mouth daily  as needed (heartburn).  . SUMAtriptan (IMITREX) 25 MG tablet Take 25 mg by mouth as needed for migraine or headache (takes 1 pill about one time a month). May repeat in 2 hours if headache persists or recurs.  . valACYclovir (VALTREX) 500 MG tablet Take 500 mg by mouth as needed.   Marland Kitchen Dextromethorphan-Guaifenesin (MUCINEX DM) 30-600 MG TB12 Take 1 tablet by mouth daily as needed  (sinus congestion).  . [DISCONTINUED] naproxen sodium (ANAPROX) 220 MG tablet Take 440 mg by mouth as needed (usually has to take about once a month).    Allergy:  Allergies  Allergen Reactions  . Shrimp [Shellfish Allergy]   . Tylenol [Acetaminophen] Rash    Severe hives    Social Hx:   History   Social History  . Marital Status: Married    Spouse Name: N/A    Number of Children: N/A  . Years of Education: N/A   Occupational History  . Not on file.   Social History Main Topics  . Smoking status: Never Smoker   . Smokeless tobacco: Not on file  . Alcohol Use: No  . Drug Use: No  . Sexual Activity: Yes    Birth Control/ Protection: Post-menopausal   Other Topics Concern  . Not on file   Social History Narrative    Past Surgical Hx:  Past Surgical History  Procedure Laterality Date  . Tubal ligation    . Abdominal hysterectomy    . Cholecystectomy    . Breast surgery    . Nasal sinus surgery    . Laparoscopy N/A 02/07/2014    Procedure: LAPAROSCOPY OPERATIVE;  Surgeon: Sharene Butters, MD;  Location: [facility];  Service: Gynecology;  Laterality: N/A;  . Laparoscopic bilateral salpingo oopherectomy Bilateral 02/07/2014    Procedure: LAPAROSCOPIC BILATERAL SALPINGO OOPHORECTOMY AND REMOVAL OF LEFT OVARIAN CYST AND PELVIC WASHINGS AND CYSTOSCOPY;  Surgeon: Sharene Butters, MD;  Location: [facility];  Service: Gynecology;  Laterality: Bilateral;  . Colonoscopy  2015    Past Medical Hx:  Past Medical History  Diagnosis Date  . GERD (gastroesophageal reflux disease)   . Complication of anesthesia     "hard to wake up"  . PONV (postoperative nausea and vomiting)     Past Gynecological History:  G2P2  No LMP recorded. Patient has had a hysterectomy for menorrhagia (remote).  Family Hx:  Family History  Problem Relation Age of Onset  . Cancer Father     lung, smoker  . Cancer Paternal Aunt     colon  . Cancer Paternal Uncle     pancreatic    Review of  Systems:  Constitutional  Feels well,  ENT Normal appearing ears and nares bilaterally Skin/Breast  No rash, sores, jaundice, itching, dryness Cardiovascular  No chest pain, shortness of breath, or edema  Pulmonary  No cough or wheeze.  Gastro Intestinal  No nausea, vomitting, or diarrhoea. No bright red blood per rectum, no abdominal pain, change in bowel movement, or constipation.  Genito Urinary  No frequency, urgency, dysuria,  Musculo Skeletal  No myalgia, arthralgia, joint swelling or pain  Neurologic  No weakness, numbness, change in gait,  Psychology  No depression, anxiety, insomnia.   Vitals:  Blood pressure 135/73, pulse 86, temperature 98.4 F (36.9 C), resp. rate 16, height 5\' 6"  (1.676 m), weight 157 lb 14.4 oz (71.623 kg).  Physical Exam: WD in NAD Neck  Supple NROM, without any enlargements.  Lymph Node Survey No cervical supraclavicular or  inguinal adenopathy Cardiovascular  Pulse normal rate, regularity and rhythm. S1 and S2 normal.  Lungs  Clear to auscultation bilateraly, without wheezes/crackles/rhonchi. Good air movement.  Skin  No rash/lesions/breakdown  Psychiatry  Alert and oriented to person, place, and time  Abdomen  Normoactive bowel sounds, abdomen soft, non-tender and non obese without evidence of hernia. 3 laparoscopic incisions healing well. Back No CVA tenderness Genito Urinary  Vulva/vagina: Normal external female genitalia.  No lesions. No discharge or bleeding.  Bladder/urethra:  No lesions or masses, well supported bladder  Vagina: atrophic, some laxity and prolapse. No lesions  Cervix: Normal appearing, no lesions.  Uterus: Surgically absent  Adnexa: no palpable masses. Rectal  Good tone, no masses no cul de sac nodularity.  Extremities  No bilateral cyanosis, clubbing or edema.   Donaciano Eva, MD   02/24/2014, 11:15 AM  CC: Dr Alden Hipp

## 2014-02-24 NOTE — Telephone Encounter (Signed)
Faxed signed order for CT scan for patient to [facility]. Scan scheduled 02/27/13 at 10:30am. Patient agreeable to pick up contrast today. Gave patient a copy of the CT orders as well to give to Imaging center when she picks up the contrast.

## 2014-02-24 NOTE — Telephone Encounter (Signed)
[facility] and spoke with Denyse Dago - she confirmed they have received the order for CT scan.

## 2014-02-24 NOTE — Patient Instructions (Signed)
We will contact you with the results of your CT scan.

## 2014-02-25 ENCOUNTER — Telehealth: Payer: Self-pay | Admitting: *Deleted

## 2014-02-25 NOTE — Telephone Encounter (Signed)
Pt insurance requires all scan to be pre-Authorized and scheduled through [facility]. CT of abd /Pelvis and chest were ordered. [facility] was called and scans were scheduled and received prior Authorization. Scans are scheduled at [facility] in [place] on  02/27/2014 . A fax was received confirming all details of appointment .Fax will be sent to Health information management to be scanned into pt's chart

## 2014-02-26 ENCOUNTER — Ambulatory Visit (HOSPITAL_COMMUNITY): Payer: BC Managed Care – PPO

## 2014-03-04 ENCOUNTER — Telehealth: Payer: Self-pay | Admitting: Gynecologic Oncology

## 2014-03-04 NOTE — Telephone Encounter (Signed)
Informed patient of the CT findings of abnormal tissue at vaginal cuff/cul de sac. In addition to the microscopic disease in right fallopian tube on final pathology. Given these findings I am recommending re-exoloration (robotic assisted laparoscopy) with staging and possible resection/debulking of disease (if found at vaginal cuff). This tissue may also represent the right ovarian remnant, the viability of which is uncertain. It would be my recommendation to proceed with chemotherapy given that her disease is not confined to the left ovary, but certainly involves right adnexal structures also and because she had rupture of the cyst.   The patient is see Dr Fransisca Connors at [facility] later this month. After that appointment she will inform us of her plan.  Donaciano Eva, MD

## 2014-04-21 ENCOUNTER — Other Ambulatory Visit: Payer: Self-pay | Admitting: Obstetrics & Gynecology

## 2014-04-21 DIAGNOSIS — R0600 Dyspnea, unspecified: Secondary | ICD-10-CM

## 2014-04-22 ENCOUNTER — Ambulatory Visit (INDEPENDENT_AMBULATORY_CARE_PROVIDER_SITE_OTHER): Payer: BLUE CROSS/BLUE SHIELD | Admitting: Internal Medicine

## 2014-04-22 DIAGNOSIS — R0600 Dyspnea, unspecified: Secondary | ICD-10-CM

## 2014-04-22 LAB — PULMONARY FUNCTION TEST
DL/VA % pred: 96 %
DL/VA: 4.85 ml/min/mmHg/L
DLCO UNC % PRED: 77 %
DLCO unc: 20.99 ml/min/mmHg
FEF 25-75 PRE: 3.71 L/s
FEF 25-75 Post: 4.45 L/sec
FEF2575-%Change-Post: 19 %
FEF2575-%PRED-POST: 173 %
FEF2575-%Pred-Pre: 144 %
FEV1-%Change-Post: 4 %
FEV1-%Pred-Post: 100 %
FEV1-%Pred-Pre: 96 %
FEV1-PRE: 2.72 L
FEV1-Post: 2.84 L
FEV1FVC-%Change-Post: 5 %
FEV1FVC-%Pred-Pre: 107 %
FEV6-%Change-Post: -1 %
FEV6-%PRED-POST: 89 %
FEV6-%Pred-Pre: 91 %
FEV6-POST: 3.14 L
FEV6-Pre: 3.19 L
FEV6FVC-%CHANGE-POST: 0 %
FEV6FVC-%PRED-POST: 103 %
FEV6FVC-%PRED-PRE: 102 %
FVC-%CHANGE-POST: -1 %
FVC-%PRED-PRE: 88 %
FVC-%Pred-Post: 86 %
FVC-Post: 3.14 L
FVC-Pre: 3.19 L
POST FEV6/FVC RATIO: 100 %
PRE FEV1/FVC RATIO: 85 %
Post FEV1/FVC ratio: 90 %
Pre FEV6/FVC Ratio: 100 %
RV % PRED: 76 %
RV: 1.55 L
TLC % PRED: 84 %
TLC: 4.51 L

## 2014-04-22 NOTE — Progress Notes (Signed)
PFT done today. 

## 2014-06-24 ENCOUNTER — Other Ambulatory Visit: Payer: Self-pay | Admitting: Obstetrics & Gynecology

## 2014-06-24 DIAGNOSIS — N6002 Solitary cyst of left breast: Secondary | ICD-10-CM

## 2014-07-24 ENCOUNTER — Ambulatory Visit
Admission: RE | Admit: 2014-07-24 | Discharge: 2014-07-24 | Disposition: A | Discharge status: Home / Self Care | Payer: BLUE CROSS/BLUE SHIELD | Source: Ambulatory Visit | Attending: Obstetrics & Gynecology | Admitting: Obstetrics & Gynecology

## 2014-07-24 DIAGNOSIS — N6002 Solitary cyst of left breast: Secondary | ICD-10-CM

## 2014-10-15 ENCOUNTER — Other Ambulatory Visit: Payer: Self-pay | Admitting: Internal Medicine

## 2014-10-15 DIAGNOSIS — R0600 Dyspnea, unspecified: Secondary | ICD-10-CM

## 2014-10-20 ENCOUNTER — Ambulatory Visit (INDEPENDENT_AMBULATORY_CARE_PROVIDER_SITE_OTHER): Payer: BLUE CROSS/BLUE SHIELD | Admitting: Internal Medicine

## 2014-10-20 DIAGNOSIS — R0600 Dyspnea, unspecified: Secondary | ICD-10-CM

## 2014-10-20 LAB — PULMONARY FUNCTION TEST
DL/VA % PRED: 91 %
DL/VA: 4.62 ml/min/mmHg/L
DLCO unc % pred: 42 %
DLCO unc: 11.32 ml/min/mmHg
FEF 25-75 POST: 3.41 L/s
FEF 25-75 Pre: 2.36 L/sec
FEF2575-%CHANGE-POST: 44 %
FEF2575-%Pred-Post: 133 %
FEF2575-%Pred-Pre: 92 %
FEV1-%Change-Post: 5 %
FEV1-%PRED-PRE: 53 %
FEV1-%Pred-Post: 56 %
FEV1-PRE: 1.49 L
FEV1-Post: 1.57 L
FEV1FVC-%CHANGE-POST: 0 %
FEV1FVC-%Pred-Pre: 115 %
FEV6-%Change-Post: 7 %
FEV6-%PRED-PRE: 46 %
FEV6-%Pred-Post: 49 %
FEV6-Post: 1.74 L
FEV6-Pre: 1.62 L
FEV6FVC-%Pred-Post: 103 %
FEV6FVC-%Pred-Pre: 103 %
FVC-%Change-Post: 5 %
FVC-%Pred-Post: 48 %
FVC-%Pred-Pre: 45 %
FVC-POST: 1.74 L
FVC-PRE: 1.65 L
PRE FEV1/FVC RATIO: 90 %
Post FEV1/FVC ratio: 90 %
Post FEV6/FVC ratio: 100 %
Pre FEV6/FVC Ratio: 100 %
RV % pred: 44 %
RV: 0.9 L
TLC % pred: 49 %
TLC: 2.67 L

## 2014-10-20 NOTE — Progress Notes (Signed)
PFT done today. 

## 2015-01-06 ENCOUNTER — Other Ambulatory Visit: Payer: Self-pay

## 2015-01-06 DIAGNOSIS — Z1231 Encounter for screening mammogram for malignant neoplasm of breast: Secondary | ICD-10-CM

## 2015-01-27 ENCOUNTER — Ambulatory Visit
Admission: RE | Admit: 2015-01-27 | Discharge: 2015-01-27 | Disposition: A | Discharge status: Home / Self Care | Payer: BLUE CROSS/BLUE SHIELD | Source: Ambulatory Visit

## 2015-01-27 DIAGNOSIS — Z1231 Encounter for screening mammogram for malignant neoplasm of breast: Secondary | ICD-10-CM

## 2015-11-18 ENCOUNTER — Other Ambulatory Visit: Payer: Self-pay | Admitting: Obstetrics and Gynecology

## 2015-11-18 DIAGNOSIS — Z1231 Encounter for screening mammogram for malignant neoplasm of breast: Secondary | ICD-10-CM

## 2016-02-01 ENCOUNTER — Ambulatory Visit
Admission: RE | Admit: 2016-02-01 | Discharge: 2016-02-01 | Disposition: A | Discharge status: Home / Self Care | Payer: BLUE CROSS/BLUE SHIELD | Source: Ambulatory Visit | Attending: Obstetrics and Gynecology | Admitting: Obstetrics and Gynecology

## 2016-02-01 DIAGNOSIS — Z1231 Encounter for screening mammogram for malignant neoplasm of breast: Secondary | ICD-10-CM

## 2016-11-09 ENCOUNTER — Other Ambulatory Visit: Payer: Self-pay | Admitting: Obstetrics and Gynecology

## 2016-11-09 DIAGNOSIS — Z1231 Encounter for screening mammogram for malignant neoplasm of breast: Secondary | ICD-10-CM

## 2017-02-01 ENCOUNTER — Ambulatory Visit
Admission: RE | Admit: 2017-02-01 | Discharge: 2017-02-01 | Disposition: A | Discharge status: Home / Self Care | Payer: BLUE CROSS/BLUE SHIELD | Source: Ambulatory Visit | Attending: Obstetrics and Gynecology | Admitting: Obstetrics and Gynecology

## 2017-02-01 DIAGNOSIS — Z1231 Encounter for screening mammogram for malignant neoplasm of breast: Secondary | ICD-10-CM

## 2018-01-01 ENCOUNTER — Other Ambulatory Visit: Payer: Self-pay | Admitting: Obstetrics and Gynecology

## 2018-01-01 DIAGNOSIS — Z1231 Encounter for screening mammogram for malignant neoplasm of breast: Secondary | ICD-10-CM

## 2018-02-12 ENCOUNTER — Ambulatory Visit
Admission: RE | Admit: 2018-02-12 | Discharge: 2018-02-12 | Disposition: A | Discharge status: Home / Self Care | Payer: BLUE CROSS/BLUE SHIELD | Source: Ambulatory Visit | Attending: Obstetrics and Gynecology | Admitting: Obstetrics and Gynecology

## 2018-02-12 DIAGNOSIS — Z1231 Encounter for screening mammogram for malignant neoplasm of breast: Secondary | ICD-10-CM

## 2018-03-09 ENCOUNTER — Other Ambulatory Visit: Payer: Self-pay | Admitting: Obstetrics and Gynecology

## 2018-03-09 DIAGNOSIS — Z1231 Encounter for screening mammogram for malignant neoplasm of breast: Secondary | ICD-10-CM

## 2019-02-18 ENCOUNTER — Other Ambulatory Visit: Payer: Self-pay

## 2019-02-18 ENCOUNTER — Ambulatory Visit
Admission: RE | Admit: 2019-02-18 | Discharge: 2019-02-18 | Disposition: A | Discharge status: Home / Self Care | Payer: BC Managed Care – PPO | Source: Ambulatory Visit | Attending: Obstetrics and Gynecology | Admitting: Obstetrics and Gynecology

## 2019-02-18 DIAGNOSIS — Z1231 Encounter for screening mammogram for malignant neoplasm of breast: Secondary | ICD-10-CM

## 2019-12-12 ENCOUNTER — Ambulatory Visit: Payer: BC Managed Care – PPO | Attending: Internal Medicine

## 2019-12-12 DIAGNOSIS — Z23 Encounter for immunization: Secondary | ICD-10-CM

## 2019-12-12 NOTE — Progress Notes (Signed)
   Covid-19 Vaccination Clinic  Name:  [name]    MRN: [id number] DOB: 11-06-1955  12/12/2019  [name] was observed post Covid-19 immunization for 15 minutes without incident. She was provided with Vaccine Information Sheet and instruction to access the V-Safe system.   [name] was instructed to call 911 with any severe reactions post vaccine: Marland Kitchen Difficulty breathing  . Swelling of face and throat  . A fast heartbeat  . A bad rash all over body  . Dizziness and weakness

## 2019-12-24 ENCOUNTER — Other Ambulatory Visit: Payer: Self-pay | Admitting: Obstetrics and Gynecology

## 2019-12-24 DIAGNOSIS — Z1231 Encounter for screening mammogram for malignant neoplasm of breast: Secondary | ICD-10-CM

## 2020-02-19 ENCOUNTER — Ambulatory Visit: Payer: BC Managed Care – PPO

## 2020-03-25 ENCOUNTER — Ambulatory Visit
Admission: RE | Admit: 2020-03-25 | Discharge: 2020-03-25 | Disposition: A | Discharge status: Home / Self Care | Payer: BC Managed Care – PPO | Source: Ambulatory Visit | Attending: Obstetrics and Gynecology | Admitting: Obstetrics and Gynecology

## 2020-03-25 ENCOUNTER — Other Ambulatory Visit: Payer: Self-pay

## 2020-03-25 DIAGNOSIS — Z1231 Encounter for screening mammogram for malignant neoplasm of breast: Secondary | ICD-10-CM

## 2020-12-09 ENCOUNTER — Other Ambulatory Visit: Payer: Self-pay | Admitting: Obstetrics and Gynecology

## 2020-12-09 DIAGNOSIS — Z1231 Encounter for screening mammogram for malignant neoplasm of breast: Secondary | ICD-10-CM

## 2021-03-29 ENCOUNTER — Ambulatory Visit
Admission: RE | Admit: 2021-03-29 | Discharge: 2021-03-29 | Disposition: A | Discharge status: Home / Self Care | Payer: BC Managed Care – PPO | Source: Ambulatory Visit | Attending: Obstetrics and Gynecology | Admitting: Obstetrics and Gynecology

## 2021-03-29 DIAGNOSIS — Z1231 Encounter for screening mammogram for malignant neoplasm of breast: Secondary | ICD-10-CM

## 2021-12-23 ENCOUNTER — Encounter: Payer: Self-pay | Admitting: Neurology

## 2021-12-23 ENCOUNTER — Ambulatory Visit (INDEPENDENT_AMBULATORY_CARE_PROVIDER_SITE_OTHER): Payer: Medicare Other | Admitting: Neurology

## 2021-12-23 VITALS — BP 147/81 | HR 89 | Ht 66.0 in | Wt 183.3 lb

## 2021-12-23 DIAGNOSIS — H9192 Unspecified hearing loss, left ear: Secondary | ICD-10-CM | POA: Diagnosis not present

## 2021-12-23 DIAGNOSIS — G5132 Clonic hemifacial spasm, left: Secondary | ICD-10-CM

## 2021-12-23 NOTE — Progress Notes (Signed)
Chief Complaint  Patient presents with   New Patient (Initial Visit)    Rm 17 here for consult on hemifacial spasms. Pt reports on going left facial spasm for the last year ( as worsened over the last year).       ASSESSMENT AND PLAN  [name] is a 66 y.o. female   Left hemifacial spasm Also complains of left hearing loss, tinnitus,  MRI of the brain with without contrast through internal acoustic canal to rule out structural abnormality  Return to clinic in 1 months for EMG guided xeomin injection preauthorization for 50 minutes today  DIAGNOSTIC DATA (LABS, IMAGING, TESTING) - I reviewed patient records, labs, notes, testing and imaging myself where available.   MEDICAL HISTORY:  [name] is a 66 year old female, seen in request by ophthalmologist Dr. Clent Jacks, for evaluation of left hemifacial spasm, her primary care physician is Dr. Redmond School, initial evaluation was on December 23, 2021  I reviewed and summarized the referring note. PMHX. Recurrent ovary cancer, s/p resection, chemotherapy,  GERD Depression, anxiety. Joint pain,  OSA   She noticed intermittent left facial twitching since 2021, gradually getting worse, sometimes to the point of closing her left eye, also noticed increased frequency, sometimes is hard for her to read, often triggered by fatigue, bright sunlight  She denies hearing loss, denies history of Bell's palsy, no dysarthria  She does complains of bilateral tinnitus, worse on the left side, was seen by ENT many years ago, reported subjective mild left hearing loss   PHYSICAL EXAM:   Vitals:   12/23/21 1545  BP: (!) 147/81  Pulse: 89  Weight: 183 lb 5 oz (83.2 kg)  Height: '5\' 6"'$  (1.676 m)   Body mass index is 29.59 kg/m.  PHYSICAL EXAMNIATION:  Gen: NAD, conversant, well nourised, well groomed                     Cardiovascular: Regular rate rhythm, no peripheral edema, warm, nontender. Eyes: Conjunctivae  clear without exudates or hemorrhage Neck: Supple, no carotid bruits. Pulmonary: Clear to auscultation bilaterally   NEUROLOGICAL EXAM:  MENTAL STATUS: Speech/cognition: Awake, alert, oriented to history taking and casual conversation CRANIAL NERVES: CN II: Visual fields are full to confrontation. Pupils are round equal and briskly reactive to light. CN III, IV, VI: extraocular movement are normal. No ptosis. CN V: Facial sensation is intact to light touch CN VII: Face is symmetric with normal eye closure, there are frequent left facial muscle twitching, mainly involving left orbicularis oculi CN VIII: Hearing is normal to causal conversation. CN IX, X: Phonation is normal. CN XI: Head turning and shoulder shrug are intact  MOTOR: There is no pronator drift of out-stretched arms. Muscle bulk and tone are normal. Muscle strength is normal.  REFLEXES: Reflexes are 2+ and symmetric at the biceps, triceps, knees, and ankles. Plantar responses are flexor.  SENSORY: Intact to light touch, pinprick and vibratory sensation are intact in fingers and toes.  COORDINATION: There is no trunk or limb dysmetria noted.  GAIT/STANCE: Posture is normal. Gait is steady with normal  REVIEW OF SYSTEMS:  Full 14 system review of systems performed and notable only for as above All other review of systems were negative.   ALLERGIES: Allergies  Allergen Reactions   Bleomycin     Lung toxicity    Shrimp [Shellfish Allergy]    Tylenol [Acetaminophen] Rash    Severe hives    HOME MEDICATIONS: Current  Outpatient Medications  Medication Sig Dispense Refill   Calcium Carbonate Antacid (CALCIUM CARBONATE, DOSED IN MG ELEMENTAL CALCIUM,) 1250 MG/5ML SUSP Take by mouth.     citalopram (CELEXA) 10 MG tablet Take 10 mg by mouth daily.     desoximetasone (TOPICORT) 0.05 % cream Apply 1 application topically as needed. Per pt, she uses it as needed for itching     Dextromethorphan-Guaifenesin  (MUCINEX DM) 30-600 MG TB12 Take 1 tablet by mouth daily as needed (sinus congestion).     diclofenac Sodium (VOLTAREN) 1 % GEL Apply 2 g topically 4 (four) times daily.     letrozole (FEMARA) 2.5 MG tablet Take 2.5 mg by mouth daily.     Multiple Vitamins-Minerals (AIRBORNE PO) Take by mouth.     naproxen sodium (ALEVE) 220 MG tablet Take 220 mg by mouth as needed.     omeprazole (PRILOSEC) 20 MG capsule Take 20 mg by mouth daily as needed (heartburn).     valACYclovir (VALTREX) 500 MG tablet Take 500 mg by mouth as needed.      No current facility-administered medications for this visit.    PAST MEDICAL HISTORY: Past Medical History:  Diagnosis Date   Complication of anesthesia    "hard to wake up"   GERD (gastroesophageal reflux disease)    PONV (postoperative nausea and vomiting)     PAST SURGICAL HISTORY: Past Surgical History:  Procedure Laterality Date   ABDOMINAL HYSTERECTOMY     BREAST EXCISIONAL BIOPSY Left 1998   benign   BREAST SURGERY     CHOLECYSTECTOMY     COLONOSCOPY  2015   LAPAROSCOPIC BILATERAL SALPINGO OOPHERECTOMY Bilateral 02/07/2014   Procedure: LAPAROSCOPIC BILATERAL SALPINGO OOPHORECTOMY AND REMOVAL OF LEFT OVARIAN CYST AND PELVIC WASHINGS AND CYSTOSCOPY;  Surgeon: Sharene Butters, MD;  Location: [facility];  Service: Gynecology;  Laterality: Bilateral;   LAPAROSCOPY N/A 02/07/2014   Procedure: LAPAROSCOPY OPERATIVE;  Surgeon: Sharene Butters, MD;  Location: [facility];  Service: Gynecology;  Laterality: N/A;   NASAL SINUS SURGERY     TUBAL LIGATION      FAMILY HISTORY: Family History  Problem Relation Age of Onset   Cancer Father        lung, smoker   Cancer Paternal Aunt        colon   Cancer Paternal Uncle        pancreatic    SOCIAL HISTORY: Social History   Socioeconomic History   Marital status: Married    Spouse name: Not on file   Number of children: Not on file   Years of education: Not on file   Highest education level: Not on file   Occupational History   Not on file  Tobacco Use   Smoking status: Never   Smokeless tobacco: Not on file  Substance and Sexual Activity   Alcohol use: No   Drug use: No   Sexual activity: Yes    Birth control/protection: Post-menopausal  Other Topics Concern   Not on file  Social History Narrative   Right handed    Caffeine 2x 12 oz cups sweet tea    Social Determinants of Health   Financial Resource Strain: Not on file  Food Insecurity: Not on file  Transportation Needs: Not on file  Physical Activity: Not on file  Stress: Not on file  Social Connections: Not on file  Intimate Partner Violence: Not on file      Marcial Pacas, M.D. Ph.D.  [facility] [phone] [address] [place], [place] [address] Ph: [phone] Fax: [phone]  CC:  Clent Jacks, MD [address] [place],  [place] [address]  Redmond School, MD

## 2021-12-27 ENCOUNTER — Telehealth: Payer: Self-pay | Admitting: Neurology

## 2021-12-27 NOTE — Telephone Encounter (Signed)
[employer] via [place], [place] sent to [place]

## 2021-12-28 ENCOUNTER — Telehealth: Payer: Self-pay

## 2021-12-28 DIAGNOSIS — G5132 Clonic hemifacial spasm, left: Secondary | ICD-10-CM

## 2021-12-28 NOTE — Telephone Encounter (Signed)
Please submit new PA for Xeomin [id number] 50 Units  95874/EMG guidance.    Chemical Denervation of neck muscles: CPT 64612  G51.3 Clonic hemifacial spasm

## 2021-12-29 ENCOUNTER — Other Ambulatory Visit (HOSPITAL_COMMUNITY): Payer: Self-pay

## 2021-12-29 NOTE — Telephone Encounter (Signed)
Patient Advocate Encounter   Received notification that prior authorization for Xeomin 50UNIT solution is required.   PA submitted on 12/29/2021 Key [id number]  Status is pending       Lyndel Safe, [place] Patient Advocate Specialist [place] Patient Advocate Team Direct Number: [phone]  Fax: [phone]

## 2021-12-29 NOTE — Telephone Encounter (Signed)
Submitted benefit verification to [employer]  Case Follow up: [id number]

## 2021-12-30 NOTE — Telephone Encounter (Signed)
Please submit BOTOX 100 units for the same Dx

## 2021-12-30 NOTE — Telephone Encounter (Signed)
Patient Advocate Encounter  Received notification that the request for prior authorization for Xeomin 50UNIT solution has been denied due to Xeomin Inj 50 Unit is not FDA approved for your medical condition(s): Hemifacial spasm of left side of face.       Lyndel Safe, [place] Patient Advocate Specialist [place] Patient Advocate Team Direct Number: [phone]  Fax: [phone]

## 2021-12-30 NOTE — Telephone Encounter (Signed)
Xeomin got denied, FYI

## 2021-12-31 ENCOUNTER — Other Ambulatory Visit (HOSPITAL_COMMUNITY): Payer: Self-pay

## 2021-12-31 NOTE — Telephone Encounter (Signed)
Patient Advocate Encounter  Prior Authorization for Botox 100UNIT solution has been approved.    PA# [id number] Effective dates: 12/31/2021 through 04/02/2022  Can be filled at [place], [place] Patient [place] Patient Advocate Team Direct Number: [phone]  Fax: [phone]

## 2021-12-31 NOTE — Telephone Encounter (Signed)
Patient Advocate Encounter   Received notification that prior authorization for Botox 100UNIT solution is required.   PA submitted on 12/31/2021 Key [id number] Status is pending       Lyndel Safe, [place] Patient Advocate Specialist [place] Patient Advocate Team Direct Number: [phone]  Fax: [phone]

## 2022-01-03 ENCOUNTER — Other Ambulatory Visit: Payer: Self-pay

## 2022-01-03 ENCOUNTER — Other Ambulatory Visit (HOSPITAL_COMMUNITY): Payer: Self-pay

## 2022-01-03 MED ORDER — BOTOX 100 UNITS IJ SOLR
100.0000 [IU] | Freq: Once | INTRAMUSCULAR | 0 refills | Status: DC
  Filled 2022-01-03: qty 1, 1d supply, fill #0

## 2022-01-03 MED ORDER — BOTOX 100 UNITS IJ SOLR
100.0000 [IU] | INTRAMUSCULAR | 0 refills | Status: AC
  Filled 2022-01-03 – 2022-02-18 (×2): qty 1, 90d supply, fill #0

## 2022-01-03 NOTE — Addendum Note (Signed)
Addended by: Gertie Baron D on: 01/03/2022 08:52 AM   Modules accepted: Orders

## 2022-01-05 ENCOUNTER — Other Ambulatory Visit (HOSPITAL_COMMUNITY): Payer: Self-pay

## 2022-01-05 NOTE — Telephone Encounter (Signed)
Patient Advocate Encounter   Received notification that prior authorization for Botox 100UNIT solution is required.   PA submitted on 01/05/2022 to [place] [employer] [place] Status is pending       Lyndel Safe, [place] Patient Advocate Specialist [place] Patient Advocate Team Direct Number: [phone]  Fax: [phone]

## 2022-01-05 NOTE — Telephone Encounter (Addendum)
Contacted pharmacy to schedule delivery of Botox.  Was informed due to pt having two insurances, that if patient filled medication through [facility], it would be a $300 co-pay [employer]).  If patient uses BCBS, that is expiring in December, she will have to fill it through [employer].  Inform pharmacist I will contact patient to see what she wants to do and will give them a call back.   I contacted pt to discuss, LVM rq CB.

## 2022-01-05 NOTE — Telephone Encounter (Signed)
Pt returned call, discussed the situation with her. She informed me to do it whichever way I would think is best. I advised her I will contact [place], since they are who we have approval through currently, and see if they have a preferred pharmacy that would reduce out of pocket cost for pt. If copay is still high, I will then resubmit PA with BCBS and see if they will approve with a lower copay. She agreed to this plan.   I have contacted [employer], 42 mins later, I was informed her copay to go through [place], would be $290. I will now get PA submitted through [place] in attempt to lower pt copay.

## 2022-01-05 NOTE — Telephone Encounter (Signed)
Can you please submit new PA with pts BCBS insurance? Current approval with [employer] is $300 copay. Pt stated BCBS deductible is met for the yr and shouldn't have to pay anything with them.

## 2022-01-07 ENCOUNTER — Other Ambulatory Visit (HOSPITAL_COMMUNITY): Payer: Self-pay

## 2022-01-07 ENCOUNTER — Ambulatory Visit (HOSPITAL_COMMUNITY)
Admission: RE | Admit: 2022-01-07 | Discharge: 2022-01-07 | Disposition: A | Discharge status: Home / Self Care | Payer: Medicare Other | Source: Ambulatory Visit | Attending: Neurology | Admitting: Neurology

## 2022-01-07 DIAGNOSIS — H9192 Unspecified hearing loss, left ear: Secondary | ICD-10-CM | POA: Insufficient documentation

## 2022-01-07 DIAGNOSIS — G5132 Clonic hemifacial spasm, left: Secondary | ICD-10-CM | POA: Diagnosis present

## 2022-01-07 MED ORDER — GADOBUTROL 1 MMOL/ML IV SOLN
7.5000 mL | Freq: Once | INTRAVENOUS | Status: AC | PRN
  Administered 2022-01-07: 7.5 mL via INTRAVENOUS

## 2022-01-10 ENCOUNTER — Other Ambulatory Visit (HOSPITAL_COMMUNITY): Payer: Self-pay

## 2022-01-10 NOTE — Telephone Encounter (Signed)
Pt is calling. Stated she wanted to follow-up on Botox injections. Pt is requesting a call-back from nurse.

## 2022-01-10 NOTE — Telephone Encounter (Signed)
Called the patient to let her know that as of this moment there has not been a update on her authorization status in regards to insurance. Pt was appreciative for the update

## 2022-01-11 ENCOUNTER — Other Ambulatory Visit (HOSPITAL_COMMUNITY): Payer: Self-pay

## 2022-01-17 ENCOUNTER — Other Ambulatory Visit (HOSPITAL_COMMUNITY): Payer: Self-pay

## 2022-01-24 NOTE — Telephone Encounter (Signed)
Pt is calling. Stated she is following up to see if the second insurance has been filed. Pt is requesting a call back from nurse

## 2022-01-24 NOTE — Telephone Encounter (Signed)
Any update on PA yet ?

## 2022-01-27 NOTE — Telephone Encounter (Signed)
Pt said she just got off phone with insurance. Stated it need to be medically necessary with a predetermination. She is requesting a call-back.

## 2022-01-27 NOTE — Telephone Encounter (Signed)
Can you assist with this ? 

## 2022-02-03 NOTE — Telephone Encounter (Signed)
Pt is calling. Stated she just wanted to schedule botox appointment. Stated she will just pay $ 300.00 co pay. Pt is requesting call-back from nurse.

## 2022-02-07 ENCOUNTER — Other Ambulatory Visit (HOSPITAL_COMMUNITY): Payer: Self-pay

## 2022-02-07 NOTE — Telephone Encounter (Signed)
Talked to insurance it is [employer], technical sales through [employer] [place]  Reference [phone]

## 2022-02-07 NOTE — Telephone Encounter (Signed)
This pt has returned call again to get an update on botox. Can we please reach out to insurance today to figure out what is needed?

## 2022-02-07 NOTE — Telephone Encounter (Signed)
Pt scheduled for botox with Dr. Krista Blue for 02/15/22 at 3:00pm

## 2022-02-07 NOTE — Telephone Encounter (Signed)
Please contact pt and get her scheduled for botox with Dr Krista Blue. BB

## 2022-02-13 IMAGING — MG MM DIGITAL SCREENING BILAT W/ TOMO AND CAD
8 series · 9 of 24 positions shown · non-contrast
Comparison: Previous exam(s).

CLINICAL DATA: Screening.

EXAM:
DIGITAL SCREENING BILATERAL MAMMOGRAM WITH TOMO AND CAD

[L MLO synth-2D]
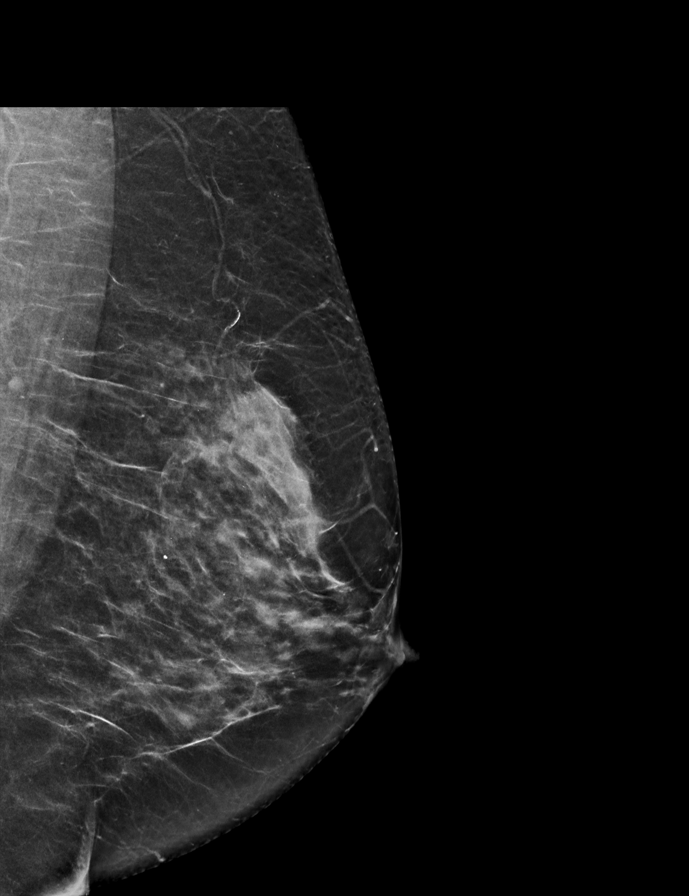

[L CC synth-2D]
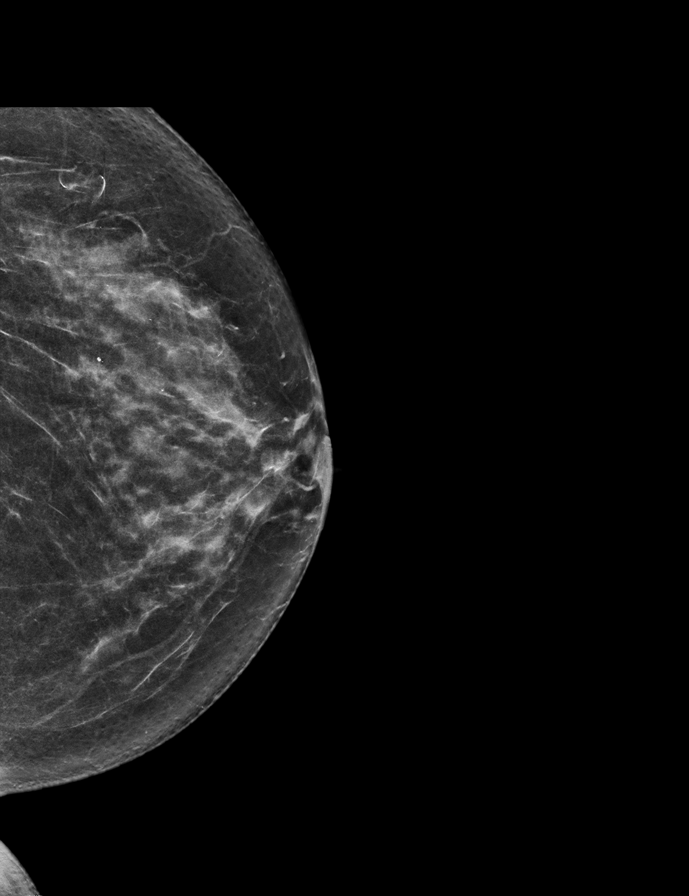

[R MLO synth-2D]
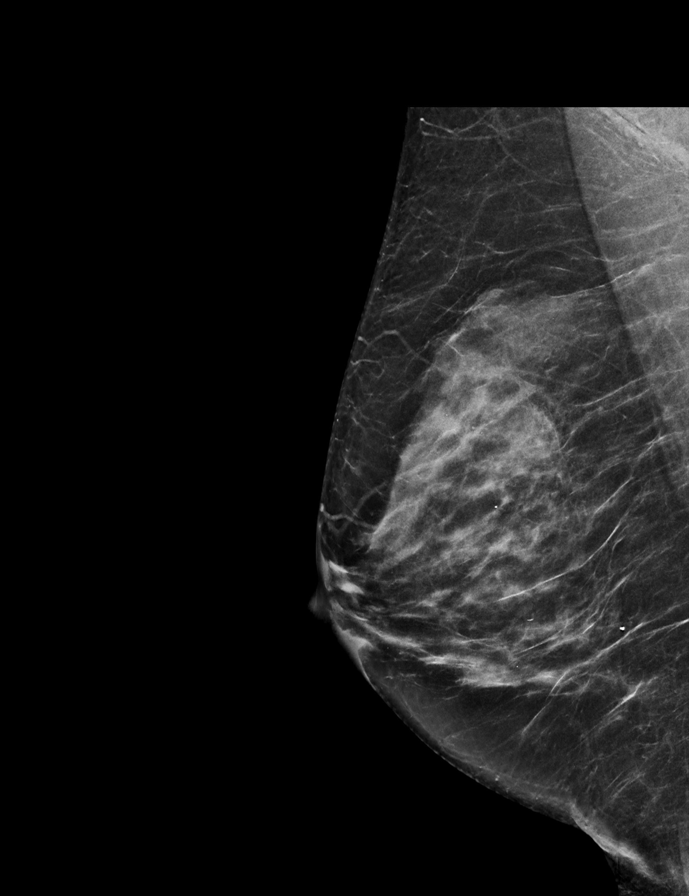

[R CC synth-2D]
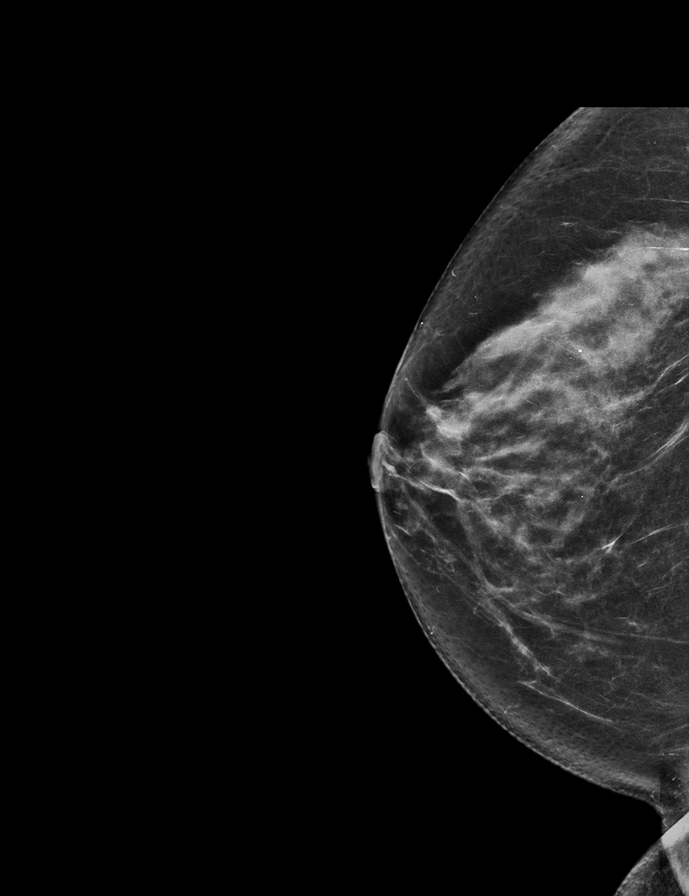

[L CC tomo · 2 of 67 frames shown]
[frame 22/67]
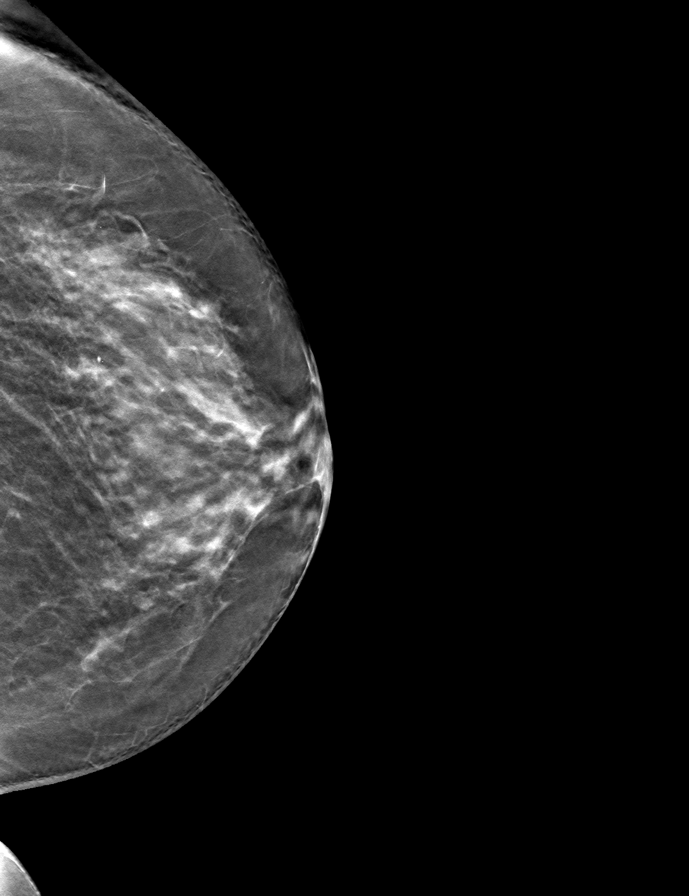
[frame 34/67]
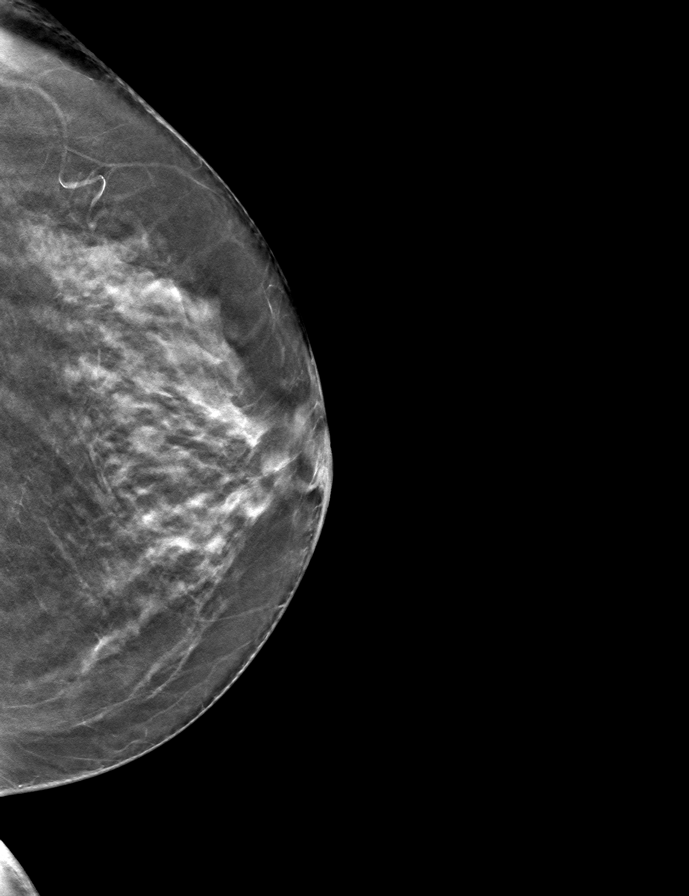

[R MLO tomo · tomo slice 34/67.0]
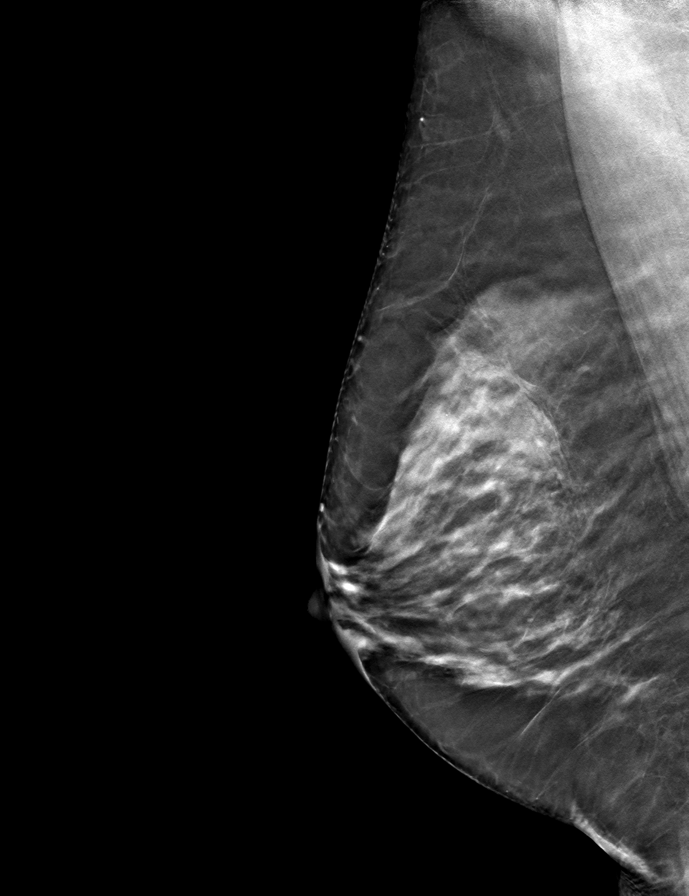

[R CC tomo · tomo slice 35/69.0]
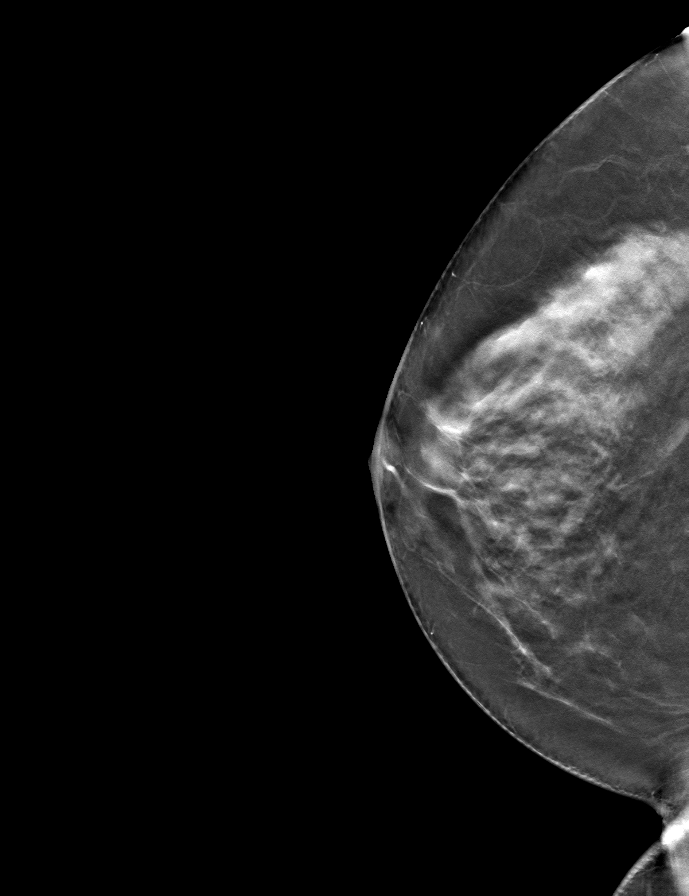

[L MLO tomo · tomo slice 34/67.0]
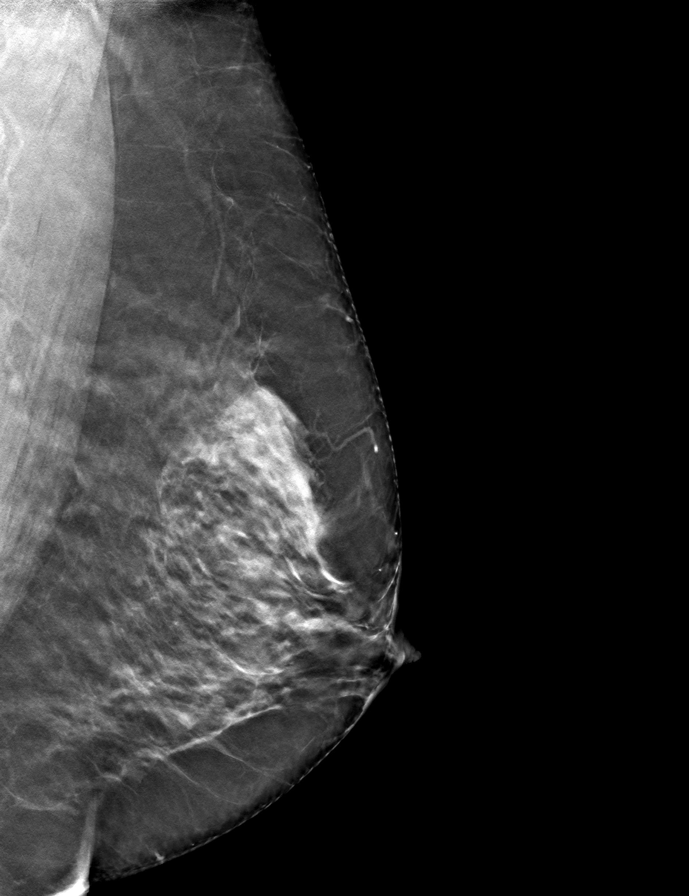

[9 of 24 positions shown; findings below may reference images not displayed]

ACR Breast Density Category c: The breast tissue is heterogeneously
dense, which may obscure small masses.
FINDINGS: There are no findings suspicious for malignancy. The images were
evaluated with computer-aided detection.
IMPRESSION: No mammographic evidence of malignancy. A result letter of this
screening mammogram will be mailed directly to the patient.

RECOMMENDATION:
Screening mammogram in one year. (Code:[id number])

BI-RADS CATEGORY  1: Negative.

## 2022-02-15 ENCOUNTER — Ambulatory Visit (INDEPENDENT_AMBULATORY_CARE_PROVIDER_SITE_OTHER): Payer: Medicare Other | Admitting: Neurology

## 2022-02-15 ENCOUNTER — Encounter: Payer: Self-pay | Admitting: Neurology

## 2022-02-15 VITALS — BP 131/79 | HR 89 | Ht 66.0 in | Wt 183.0 lb

## 2022-02-15 DIAGNOSIS — G5132 Clonic hemifacial spasm, left: Secondary | ICD-10-CM

## 2022-02-15 MED ORDER — ONABOTULINUMTOXINA 100 UNITS IJ SOLR: 100.0000 [IU] | Freq: Once | INTRAMUSCULAR | Status: DC

## 2022-02-15 MED ORDER — ONABOTULINUMTOXINA 100 UNITS IJ SOLR
100.0000 [IU] | Freq: Once | INTRAMUSCULAR | Status: AC
  Administered 2022-02-15: 100 [IU] via INTRAMUSCULAR

## 2022-02-15 NOTE — Progress Notes (Signed)
Chief Complaint  Patient presents with   Procedure    Botox 100 units       ASSESSMENT AND PLAN  [name] is a 66 y.o. female   Left hemifacial spasm Also complains of left hearing loss, tinnitus,  MRI of the brain with without contrast through internal acoustic canal in November 2023 was normal   First EMG guided Botox injection on February 15, 2022, potential side effect explained, 100 units of Botox a was dissolving to 2 cc of normal saline, used total 37.5 units, discard 62.5 units  Left orbicularis oculi at 2, 3, 4, 5, 6, 7:00, ( 2.5 units each x6=15 units) Right orbicularis oculi at 7, 8, 9:00 (2.5 units x3= 7.5 units)  Left frontalis 5 units Left corrugate 2.5 units Right corrugate 2.5 units Procerus 5 units  DIAGNOSTIC DATA (LABS, IMAGING, TESTING) - I reviewed patient records, labs, notes, testing and imaging myself where available.   MEDICAL HISTORY:  [name] is a 66 year old female, seen in request by ophthalmologist Dr. Clent Jacks, for evaluation of left hemifacial spasm, her primary care physician is Dr. Redmond School, initial evaluation was on December 23, 2021  I reviewed and summarized the referring note. PMHX. Recurrent ovary cancer, s/p resection, chemotherapy,  GERD Depression, anxiety. Joint pain,  OSA   She noticed intermittent left facial twitching since 2021, gradually getting worse, sometimes to the point of closing her left eye, also noticed increased frequency, sometimes is hard for her to read, often triggered by fatigue, bright sunlight  She denies hearing loss, denies history of Bell's palsy, no dysarthria  She does complains of bilateral tinnitus, worse on the left side, was seen by ENT many years ago, reported subjective mild left hearing loss  Update February 15, 2022 This is her first EMG guided Botox injection, related question was answered  MRI of the brain with without contrast through internal canal  November 2023 was normal   PHYSICAL EXAM:   Vitals:   02/15/22 1512  BP: 131/79  Pulse: 89  Weight: 183 lb (83 kg)  Height: '5\' 6"'$  (1.676 m)   Body mass index is 29.54 kg/m.  PHYSICAL EXAMNIATION:  Gen: NAD, conversant, well nourised, well groomed                     Cardiovascular: Regular rate rhythm, no peripheral edema, warm, nontender. Eyes: Conjunctivae clear without exudates or hemorrhage Neck: Supple, no carotid bruits. Pulmonary: Clear to auscultation bilaterally   NEUROLOGICAL EXAM:  MENTAL STATUS: Speech/cognition: Awake, alert, oriented to history taking and casual conversation CRANIAL NERVES: CN II: Visual fields are full to confrontation. Pupils are round equal and briskly reactive to light. CN III, IV, VI: extraocular movement are normal. No ptosis. CN V: Facial sensation is intact to light touch CN VII: Face is symmetric with normal eye closure, there are frequent left facial muscle twitching, mainly involving left orbicularis oculi CN VIII: Hearing is normal to causal conversation. CN IX, X: Phonation is normal. CN XI: Head turning and shoulder shrug are intact  MOTOR: There is no pronator drift of out-stretched arms. Muscle bulk and tone are normal. Muscle strength is normal.  REFLEXES: Reflexes are 2+ and symmetric at the biceps, triceps, knees, and ankles. Plantar responses are flexor.  SENSORY: Intact to light touch, pinprick and vibratory sensation are intact in fingers and toes.  COORDINATION: There is no trunk or limb dysmetria noted.  GAIT/STANCE: Posture is normal. Gait is steady  with normal  REVIEW OF SYSTEMS:  Full 14 system review of systems performed and notable only for as above All other review of systems were negative.   ALLERGIES: Allergies  Allergen Reactions   Bleomycin     Lung toxicity    Shrimp [Shellfish Allergy]    Tylenol [Acetaminophen] Rash    Severe hives    HOME MEDICATIONS: Current Outpatient Medications   Medication Sig Dispense Refill   botulinum toxin Type A (BOTOX) 100 units SOLR injection Inject 100 Units into the muscle every 3 (three) months. Inject 100 Units into the muscles once every 90 days. 1 each 0   Calcium Carbonate Antacid (CALCIUM CARBONATE, DOSED IN MG ELEMENTAL CALCIUM,) 1250 MG/5ML SUSP Take by mouth.     citalopram (CELEXA) 10 MG tablet Take 10 mg by mouth daily.     desoximetasone (TOPICORT) 0.05 % cream Apply 1 application topically as needed. Per pt, she uses it as needed for itching     Dextromethorphan-Guaifenesin (MUCINEX DM) 30-600 MG TB12 Take 1 tablet by mouth daily as needed (sinus congestion).     diclofenac Sodium (VOLTAREN) 1 % GEL Apply 2 g topically 4 (four) times daily.     letrozole (FEMARA) 2.5 MG tablet Take 2.5 mg by mouth daily.     Multiple Vitamins-Minerals (AIRBORNE PO) Take by mouth.     naproxen sodium (ALEVE) 220 MG tablet Take 220 mg by mouth as needed.     omeprazole (PRILOSEC) 20 MG capsule Take 20 mg by mouth daily as needed (heartburn).     valACYclovir (VALTREX) 500 MG tablet Take 500 mg by mouth as needed.      Current Facility-Administered Medications  Medication Dose Route Frequency Provider Last Rate Last Admin   botulinum toxin Type A (BOTOX) injection 100 Units  100 Units Intramuscular Once Marcial Pacas, MD        PAST MEDICAL HISTORY: Past Medical History:  Diagnosis Date   Complication of anesthesia    "hard to wake up"   GERD (gastroesophageal reflux disease)    PONV (postoperative nausea and vomiting)     PAST SURGICAL HISTORY: Past Surgical History:  Procedure Laterality Date   ABDOMINAL HYSTERECTOMY     BREAST EXCISIONAL BIOPSY Left 1998   benign   BREAST SURGERY     CHOLECYSTECTOMY     COLONOSCOPY  2015   LAPAROSCOPIC BILATERAL SALPINGO OOPHERECTOMY Bilateral 02/07/2014   Procedure: LAPAROSCOPIC BILATERAL SALPINGO OOPHORECTOMY AND REMOVAL OF LEFT OVARIAN CYST AND PELVIC WASHINGS AND CYSTOSCOPY;  Surgeon: Sharene Butters, MD;  Location: [facility];  Service: Gynecology;  Laterality: Bilateral;   LAPAROSCOPY N/A 02/07/2014   Procedure: LAPAROSCOPY OPERATIVE;  Surgeon: Sharene Butters, MD;  Location: [facility];  Service: Gynecology;  Laterality: N/A;   NASAL SINUS SURGERY     TUBAL LIGATION      FAMILY HISTORY: Family History  Problem Relation Age of Onset   Cancer Father        lung, smoker   Cancer Paternal Aunt        colon   Cancer Paternal Uncle        pancreatic    SOCIAL HISTORY: Social History   Socioeconomic History   Marital status: Married    Spouse name: Not on file   Number of children: Not on file   Years of education: Not on file   Highest education level: Not on file  Occupational History   Not on file  Tobacco Use   Smoking  status: Never   Smokeless tobacco: Not on file  Substance and Sexual Activity   Alcohol use: No   Drug use: No   Sexual activity: Yes    Birth control/protection: Post-menopausal  Other Topics Concern   Not on file  Social History Narrative   Right handed    Caffeine 2x 12 oz cups sweet tea    Social Determinants of Health   Financial Resource Strain: Not on file  Food Insecurity: Not on file  Transportation Needs: Not on file  Physical Activity: Not on file  Stress: Not on file  Social Connections: Not on file  Intimate Partner Violence: Not on file      Marcial Pacas, M.D. Ph.D.  [facility] [address] [place], [place] [address] Ph: [phone] Fax: [phone]  CC:  Redmond School, MD [place],   [id number]  Redmond School, MD

## 2022-02-15 NOTE — Progress Notes (Signed)
Botox 100 units x 1 vial [id number] Exp-05/2024 [phone] B/B

## 2022-02-16 ENCOUNTER — Other Ambulatory Visit (HOSPITAL_COMMUNITY): Payer: Self-pay

## 2022-02-18 ENCOUNTER — Other Ambulatory Visit (HOSPITAL_COMMUNITY): Payer: Self-pay

## 2022-02-25 ENCOUNTER — Other Ambulatory Visit: Payer: Self-pay | Admitting: Obstetrics and Gynecology

## 2022-02-25 DIAGNOSIS — Z1231 Encounter for screening mammogram for malignant neoplasm of breast: Secondary | ICD-10-CM

## 2022-03-02 ENCOUNTER — Telehealth: Payer: Self-pay | Admitting: Neurology

## 2022-03-02 NOTE — Telephone Encounter (Signed)
LVM and sent mychart msg informing pt of need to reschedule 05/11/22 appointment - MD out

## 2022-03-13 ENCOUNTER — Ambulatory Visit
Admission: EM | Admit: 2022-03-13 | Discharge: 2022-03-13 | Disposition: A | Discharge status: Home / Self Care | Payer: Medicare Other | Attending: Urgent Care | Admitting: Urgent Care

## 2022-03-13 ENCOUNTER — Encounter: Payer: Self-pay | Admitting: Emergency Medicine

## 2022-03-13 DIAGNOSIS — J309 Allergic rhinitis, unspecified: Secondary | ICD-10-CM | POA: Diagnosis not present

## 2022-03-13 DIAGNOSIS — H1013 Acute atopic conjunctivitis, bilateral: Secondary | ICD-10-CM

## 2022-03-13 DIAGNOSIS — J069 Acute upper respiratory infection, unspecified: Secondary | ICD-10-CM | POA: Diagnosis not present

## 2022-03-13 DIAGNOSIS — H5789 Other specified disorders of eye and adnexa: Secondary | ICD-10-CM | POA: Diagnosis not present

## 2022-03-13 MED ORDER — PREDNISONE 20 MG PO TABS: 40.0000 mg | ORAL_TABLET | Freq: Every day | ORAL | 0 refills | Status: DC

## 2022-03-13 MED ORDER — AZELASTINE HCL 0.05 % OP SOLN: 1.0000 [drp] | Freq: Two times a day (BID) | OPHTHALMIC | 0 refills | Status: DC

## 2022-03-13 NOTE — ED Triage Notes (Signed)
Watery eyes, green discharge from eyes.  States ears feel stopped up.  Has been taking mucinex

## 2022-03-13 NOTE — ED Provider Notes (Signed)
[facility]  Note:  This document was prepared using Dragon voice recognition software and may include unintentional dictation errors.  MRN: [id number] DOB: Nov 23, 1955  Subjective:   [name] is a 67 y.o. female presenting for 1 day history of acute onset bilateral eye irritation, watering, mucus from her eyes this morning.  She is also had sinus congestion and drainage, slight throat pain.  Has had longstanding history of difficulty with her sinuses.  No fever, cough, chest pain, shortness of breath or wheezing.   Current Facility-Administered Medications:    botulinum toxin Type A (BOTOX) injection 100 Units, 100 Units, Intramuscular, Once, Marcial Pacas, MD  Current Outpatient Medications:    botulinum toxin Type A (BOTOX) 100 units SOLR injection, Inject 100 Units into the muscle every 3 (three) months. Inject 100 Units into the muscles once every 90 days., Disp: 1 each, Rfl: 0   Calcium Carbonate Antacid (CALCIUM CARBONATE, DOSED IN MG ELEMENTAL CALCIUM,) 1250 MG/5ML SUSP, Take by mouth., Disp: , Rfl:    citalopram (CELEXA) 10 MG tablet, Take 10 mg by mouth daily., Disp: , Rfl:    desoximetasone (TOPICORT) 0.05 % cream, Apply 1 application topically as needed. Per pt, she uses it as needed for itching, Disp: , Rfl:    Dextromethorphan-Guaifenesin (MUCINEX DM) 30-600 MG TB12, Take 1 tablet by mouth daily as needed (sinus congestion)., Disp: , Rfl:    diclofenac Sodium (VOLTAREN) 1 % GEL, Apply 2 g topically 4 (four) times daily., Disp: , Rfl:    letrozole (FEMARA) 2.5 MG tablet, Take 2.5 mg by mouth daily., Disp: , Rfl:    Multiple Vitamins-Minerals (AIRBORNE PO), Take by mouth., Disp: , Rfl:    naproxen sodium (ALEVE) 220 MG tablet, Take 220 mg by mouth as needed., Disp: , Rfl:    omeprazole (PRILOSEC) 20 MG capsule, Take 20 mg by mouth daily as needed (heartburn)., Disp: , Rfl:    valACYclovir (VALTREX) 500 MG tablet, Take 500 mg by mouth as needed. , Disp: ,  Rfl:    Allergies  Allergen Reactions   Bleomycin     Lung toxicity    Shrimp [Shellfish Allergy]    Tylenol [Acetaminophen] Rash    Severe hives    Past Medical History:  Diagnosis Date   Complication of anesthesia    "hard to wake up"   GERD (gastroesophageal reflux disease)    PONV (postoperative nausea and vomiting)      Past Surgical History:  Procedure Laterality Date   ABDOMINAL HYSTERECTOMY     BREAST EXCISIONAL BIOPSY Left 1998   benign   BREAST SURGERY     CHOLECYSTECTOMY     COLONOSCOPY  2015   LAPAROSCOPIC BILATERAL SALPINGO OOPHERECTOMY Bilateral 02/07/2014   Procedure: LAPAROSCOPIC BILATERAL SALPINGO OOPHORECTOMY AND REMOVAL OF LEFT OVARIAN CYST AND PELVIC WASHINGS AND CYSTOSCOPY;  Surgeon: Sharene Butters, MD;  Location: [facility];  Service: Gynecology;  Laterality: Bilateral;   LAPAROSCOPY N/A 02/07/2014   Procedure: LAPAROSCOPY OPERATIVE;  Surgeon: Sharene Butters, MD;  Location: [facility];  Service: Gynecology;  Laterality: N/A;   NASAL SINUS SURGERY     TUBAL LIGATION      Family History  Problem Relation Age of Onset   Cancer Father        lung, smoker   Cancer Paternal Aunt        colon   Cancer Paternal Uncle        pancreatic    Social History   Tobacco Use  Smoking status: Never  Substance Use Topics   Alcohol use: No   Drug use: No    ROS   Objective:   Vitals: BP 122/80 (BP Location: Right Arm)   Pulse 98   Temp 98.3 F (36.8 C) (Oral)   Resp 18   SpO2 97%   Physical Exam Constitutional:      General: She is not in acute distress.    Appearance: Normal appearance. She is well-developed and normal weight. She is not ill-appearing, toxic-appearing or diaphoretic.  HENT:     Head: Normocephalic and atraumatic.     Right Ear: Tympanic membrane, ear canal and external ear normal. No drainage or tenderness. No middle ear effusion. There is no impacted cerumen. Tympanic membrane is not erythematous or bulging.     Left Ear:  Tympanic membrane, ear canal and external ear normal. No drainage or tenderness.  No middle ear effusion. There is no impacted cerumen. Tympanic membrane is not erythematous or bulging.     Nose: Congestion and rhinorrhea present.     Mouth/Throat:     Mouth: Mucous membranes are moist. No oral lesions.     Pharynx: No pharyngeal swelling, oropharyngeal exudate, posterior oropharyngeal erythema or uvula swelling.     Tonsils: No tonsillar exudate or tonsillar abscesses.  Eyes:     General: Lids are normal. Lids are everted, no foreign bodies appreciated. Vision grossly intact. No scleral icterus.       Right eye: No foreign body, discharge or hordeolum.        Left eye: No foreign body, discharge or hordeolum.     Extraocular Movements: Extraocular movements intact.     Right eye: Normal extraocular motion.     Left eye: Normal extraocular motion and no nystagmus.     Conjunctiva/sclera:     Right eye: Right conjunctiva is not injected. No chemosis, exudate or hemorrhage.    Left eye: Left conjunctiva is injected. No chemosis, exudate or hemorrhage.    Comments: Watery eyes bilaterally.   Cardiovascular:     Rate and Rhythm: Normal rate.  Pulmonary:     Effort: Pulmonary effort is normal.  Musculoskeletal:     Cervical back: Normal range of motion and neck supple.  Lymphadenopathy:     Cervical: No cervical adenopathy.  Skin:    General: Skin is warm and dry.  Neurological:     General: No focal deficit present.     Mental Status: She is alert and oriented to person, place, and time.  Psychiatric:        Mood and Affect: Mood normal.        Behavior: Behavior normal.     Assessment and Plan :   PDMP not reviewed this encounter.  1. Allergic rhinitis, unspecified seasonality, unspecified trigger   2. Viral upper respiratory infection   3. Irritation of both eyes   4. Allergic conjunctivitis and rhinitis, bilateral     Patient deferred COVID test for now and I am in  agreement.  Recommended managing for allergic rhinitis, conjunctivitis with an oral prednisone course in addition to her Zyrtec.  Will also use azelastine eyedrops.  Patient reports that she typically does very well with prednisone. Counseled patient on potential for adverse effects with medications prescribed/recommended today, ER and return-to-clinic precautions discussed, patient verbalized understanding.    Jaynee Eagles, [place] 03/13/22 1429

## 2022-04-19 ENCOUNTER — Ambulatory Visit: Payer: BC Managed Care – PPO

## 2022-05-11 ENCOUNTER — Telehealth: Payer: Self-pay | Admitting: Neurology

## 2022-05-11 ENCOUNTER — Ambulatory Visit: Payer: Medicare Other | Admitting: Neurology

## 2022-05-11 NOTE — Telephone Encounter (Signed)
Can you please start auth for pt's Botox under [employer]? [employer] is no longer active, she's scheduled for 05/25/22.

## 2022-05-13 ENCOUNTER — Other Ambulatory Visit (HOSPITAL_COMMUNITY): Payer: Self-pay

## 2022-05-13 NOTE — Telephone Encounter (Signed)
    This will be Buy and Bill 

## 2022-05-13 NOTE — Telephone Encounter (Signed)
Benefit Verification [id number] Submitted! Botox One

## 2022-05-25 ENCOUNTER — Ambulatory Visit: Payer: Medicare Other | Admitting: Neurology

## 2022-05-25 ENCOUNTER — Encounter: Payer: Self-pay | Admitting: Neurology

## 2022-05-25 VITALS — BP 132/84 | HR 74 | Ht 66.0 in | Wt 183.0 lb

## 2022-05-25 DIAGNOSIS — G5132 Clonic hemifacial spasm, left: Secondary | ICD-10-CM | POA: Diagnosis not present

## 2022-05-25 MED ORDER — ONABOTULINUMTOXINA 100 UNITS IJ SOLR
100.0000 [IU] | Freq: Once | INTRAMUSCULAR | Status: AC
Start: 2022-05-25 — End: 2022-05-25
  Administered 2022-05-25: 100 [IU] via INTRAMUSCULAR

## 2022-05-25 NOTE — Progress Notes (Signed)
   Chief Complaint  Patient presents with   Procedure    Botox 100 units       ASSESSMENT AND PLAN  [name] is a 67 y.o. female   Left hemifacial spasm Also complains of left hearing loss, tinnitus,  MRI of the brain with without contrast through internal acoustic canal in November 2023 was normal   First EMG guided Botox injection on February 15, 2022,    UPDATE May 25 2022: She responded well to previous injection no significant side effect noted  Under EMG guidance, used BOTOX A (100 units/2 cc of NS) Used  2.5 units x15=37.5 units, discard 62.5 unites   PHYSICAL EXAM: Vitals:   05/25/22 1628  BP: 132/84  Pulse: 74    Marcial Pacas, M.D. Ph.D.  [facility] [address] [place], [place] [address] Ph: [phone] Fax: [phone]  CC:  Redmond School, MD [place],  [place] [address]  Redmond School, MD

## 2022-05-25 NOTE — Progress Notes (Signed)
Botox 100 units x 1 vial- only used 50 units  [id number] [phone] Exp-06/2024 B/B  Witnessed by Jinny Blossom

## 2022-06-14 ENCOUNTER — Ambulatory Visit
Admission: RE | Admit: 2022-06-14 | Discharge: 2022-06-14 | Disposition: A | Discharge status: Home / Self Care | Payer: Medicare Other | Source: Ambulatory Visit | Attending: Obstetrics and Gynecology | Admitting: Obstetrics and Gynecology

## 2022-06-14 DIAGNOSIS — Z1231 Encounter for screening mammogram for malignant neoplasm of breast: Secondary | ICD-10-CM

## 2022-07-12 ENCOUNTER — Telehealth: Payer: Self-pay | Admitting: Neurology

## 2022-07-12 NOTE — Telephone Encounter (Signed)
LVM and sent mychart msg informing pt of appt change due to provider schedule change 

## 2022-08-22 ENCOUNTER — Ambulatory Visit: Payer: Medicare Other | Admitting: Neurology

## 2022-08-22 VITALS — BP 146/80

## 2022-08-22 DIAGNOSIS — G5132 Clonic hemifacial spasm, left: Secondary | ICD-10-CM

## 2022-08-22 DIAGNOSIS — H9192 Unspecified hearing loss, left ear: Secondary | ICD-10-CM | POA: Diagnosis not present

## 2022-08-22 MED ORDER — ONABOTULINUMTOXINA 100 UNITS IJ SOLR
100.0000 [IU] | Freq: Once | INTRAMUSCULAR | Status: AC
Start: 2022-08-22 — End: 2022-08-22
  Administered 2022-08-22: 100 [IU] via INTRAMUSCULAR

## 2022-08-22 NOTE — Progress Notes (Signed)
Botox- 100 units x 1 vial Lot: [id number] Expiration: 08.2026 NDC: 0023-3921-02  Bacteriostatic 0.9% Sodium Chloride- 2 mL  Lot: [id number] Expiration: 04.01.2025 NDC: 5621308657  Dx: g45.32 B/B Witnessed by April, RN

## 2022-08-22 NOTE — Progress Notes (Signed)
   Chief Complaint  Patient presents with   Procedure    Rm13, husband present, Pt is well and ready for botox injection      ASSESSMENT AND PLAN  [name] is a 67 y.o. female   Left hemifacial spasm Also complains of left hearing loss, tinnitus,  MRI of the brain with without contrast through internal acoustic canal in November 2023 was normal   First EMG guided Botox injection on February 15, 2022,    UPDATE May 25 2022: She responded well to previous injection no significant side effect noted  Under EMG guidance, used BOTOX A (100 units/2 cc of NS) Used  2.5 units x23 =57.5 units, discard 42.5 unites   PHYSICAL EXAM: Vitals:   08/22/22 1300 08/22/22 1306  BP: (!) 173/89 (!) 146/80    Levert Feinstein, M.D. Ph.D.  [facility] [address] [place], [place] [address] Ph: [phone] Fax: [phone]  CC:  Elfredia Nevins, MD [address] [place],  [place] [address]  Elfredia Nevins, MD

## 2022-09-07 ENCOUNTER — Ambulatory Visit: Payer: Medicare Other | Admitting: Neurology

## 2023-02-24 ENCOUNTER — Other Ambulatory Visit: Payer: Self-pay | Admitting: Obstetrics and Gynecology

## 2023-02-24 DIAGNOSIS — Z1231 Encounter for screening mammogram for malignant neoplasm of breast: Secondary | ICD-10-CM

## 2023-06-16 ENCOUNTER — Ambulatory Visit
Admission: RE | Admit: 2023-06-16 | Discharge: 2023-06-16 | Disposition: A | Discharge status: Home / Self Care | Payer: Medicare Other | Source: Ambulatory Visit | Attending: Obstetrics and Gynecology | Admitting: Obstetrics and Gynecology

## 2023-06-16 DIAGNOSIS — Z1231 Encounter for screening mammogram for malignant neoplasm of breast: Secondary | ICD-10-CM

## 2023-06-16 HISTORY — DX: Malignant neoplasm of unspecified ovary: C56.9

## 2023-09-03 LAB — COLOGUARD

## 2024-01-04 ENCOUNTER — Other Ambulatory Visit: Payer: Self-pay | Admitting: Obstetrics and Gynecology

## 2024-01-04 DIAGNOSIS — Z1231 Encounter for screening mammogram for malignant neoplasm of breast: Secondary | ICD-10-CM

## 2024-01-30 ENCOUNTER — Ambulatory Visit: Payer: Self-pay

## 2024-02-20 ENCOUNTER — Ambulatory Visit: Payer: Self-pay

## 2024-03-28 ENCOUNTER — Ambulatory Visit: Payer: Self-pay

## 2024-03-28 NOTE — Telephone Encounter (Signed)
 FYI Only or Action Required?: FYI only for provider: Pt will return to UC or ED.  Patient was last seen in primary care on not a [facility] pt.  Called Nurse Triage reporting Headache.  Symptoms began several days ago.  Interventions attempted: Other: went to UC - given medications.  Symptoms are: unchanged.  Triage Disposition: See HCP Within 4 Hours (Or PCP Triage)  Patient/caregiver understands and will follow disposition?: Yes                               Reason for Triage: Bad headache . Still in pain (pretty severe) nerve thing from right shoulder to side of head   Reason for Disposition  [1] SEVERE headache (e.g., excruciating) AND [2] not improved after 2 hours of pain medicine  Answer Assessment - Initial Assessment Questions 1. LOCATION: Where does it hurt?      Right shoulder  - and side of head 2. ONSET: When did the headache start? (e.g., minutes, hours, days)      A few days ago 3. PATTERN: Does the pain come and go, or has it been constant since it started?     constant 4. SEVERITY: How bad is the pain? and What does it keep you from doing?  (e.g., Scale 1-10; mild, moderate, or severe)     moderate 5. RECURRENT SYMPTOM: Have you ever had headaches before? If Yes, ask: When was the last time? and What happened that time?      Yes - not like this 6. CAUSE: What do you think is causing the headache?     Per UC Sprain of ligaments of cervical spine 7. MIGRAINE: Have you been diagnosed with migraine headaches? If Yes, ask: Is this headache similar?       A long time ago 8. HEAD INJURY: Has there been any recent injury to your head?      no  Protocols used: Headache-A-AH

## 2024-04-02 ENCOUNTER — Ambulatory Visit

## 2024-05-10 ENCOUNTER — Ambulatory Visit

## 2024-05-13 ENCOUNTER — Ambulatory Visit

## 2024-06-17 ENCOUNTER — Ambulatory Visit

## 2024-08-26 ENCOUNTER — Ambulatory Visit
